# Patient Record
Sex: Female | Born: 1955 | Race: Black or African American | Hispanic: No | Smoking: Never smoker
Health system: Southern US, Community
[De-identification: ages and names within clinical notes are randomized; demographics above are authoritative.]

## PROBLEM LIST (undated history)

## (undated) DIAGNOSIS — I10 Essential (primary) hypertension: Secondary | ICD-10-CM

## (undated) DIAGNOSIS — T7840XA Allergy, unspecified, initial encounter: Secondary | ICD-10-CM

## (undated) DIAGNOSIS — E785 Hyperlipidemia, unspecified: Secondary | ICD-10-CM

## (undated) DIAGNOSIS — E119 Type 2 diabetes mellitus without complications: Secondary | ICD-10-CM

## (undated) HISTORY — PX: ABDOMINAL HYSTERECTOMY: SHX81

## (undated) HISTORY — DX: Allergy, unspecified, initial encounter: T78.40XA

## (undated) HISTORY — DX: Essential (primary) hypertension: I10

## (undated) HISTORY — DX: Type 2 diabetes mellitus without complications: E11.9

## (undated) HISTORY — DX: Hyperlipidemia, unspecified: E78.5

---

## 1998-04-16 ENCOUNTER — Ambulatory Visit (HOSPITAL_COMMUNITY): Admission: RE | Admit: 1998-04-16 | Discharge: 1998-04-16 | Payer: Self-pay | Admitting: *Deleted

## 1999-12-09 ENCOUNTER — Other Ambulatory Visit: Admission: RE | Admit: 1999-12-09 | Discharge: 1999-12-09 | Payer: Self-pay | Admitting: Obstetrics and Gynecology

## 2000-02-16 ENCOUNTER — Encounter: Payer: Self-pay | Admitting: Family Medicine

## 2000-02-16 ENCOUNTER — Ambulatory Visit (HOSPITAL_COMMUNITY): Admission: RE | Admit: 2000-02-16 | Discharge: 2000-02-16 | Payer: Self-pay | Admitting: Family Medicine

## 2000-12-27 ENCOUNTER — Other Ambulatory Visit: Admission: RE | Admit: 2000-12-27 | Discharge: 2000-12-27 | Payer: Self-pay | Admitting: Obstetrics and Gynecology

## 2002-02-23 ENCOUNTER — Other Ambulatory Visit: Admission: RE | Admit: 2002-02-23 | Discharge: 2002-02-23 | Payer: Self-pay | Admitting: Obstetrics and Gynecology

## 2003-01-08 ENCOUNTER — Encounter: Payer: Self-pay | Admitting: Emergency Medicine

## 2003-01-08 ENCOUNTER — Emergency Department (HOSPITAL_COMMUNITY): Admission: EM | Admit: 2003-01-08 | Discharge: 2003-01-08 | Payer: Self-pay | Admitting: Emergency Medicine

## 2003-04-25 ENCOUNTER — Other Ambulatory Visit: Admission: RE | Admit: 2003-04-25 | Discharge: 2003-04-25 | Payer: Self-pay | Admitting: Obstetrics and Gynecology

## 2004-05-07 ENCOUNTER — Other Ambulatory Visit: Admission: RE | Admit: 2004-05-07 | Discharge: 2004-05-07 | Payer: Self-pay | Admitting: Obstetrics and Gynecology

## 2005-06-15 ENCOUNTER — Other Ambulatory Visit: Admission: RE | Admit: 2005-06-15 | Discharge: 2005-06-15 | Payer: Self-pay | Admitting: Obstetrics and Gynecology

## 2007-10-04 ENCOUNTER — Ambulatory Visit (HOSPITAL_COMMUNITY): Admission: RE | Admit: 2007-10-04 | Discharge: 2007-10-04 | Payer: Self-pay | Admitting: Family Medicine

## 2007-10-04 ENCOUNTER — Encounter (INDEPENDENT_AMBULATORY_CARE_PROVIDER_SITE_OTHER): Payer: Self-pay | Admitting: Family Medicine

## 2007-10-12 ENCOUNTER — Emergency Department (HOSPITAL_COMMUNITY): Admission: EM | Admit: 2007-10-12 | Discharge: 2007-10-12 | Payer: Self-pay | Admitting: Emergency Medicine

## 2010-01-07 ENCOUNTER — Encounter: Admission: RE | Admit: 2010-01-07 | Discharge: 2010-01-07 | Payer: Self-pay | Admitting: Family Medicine

## 2010-01-14 ENCOUNTER — Ambulatory Visit (HOSPITAL_COMMUNITY): Admission: RE | Admit: 2010-01-14 | Discharge: 2010-01-14 | Payer: Self-pay | Admitting: General Surgery

## 2010-03-16 ENCOUNTER — Encounter: Admission: RE | Admit: 2010-03-16 | Discharge: 2010-03-16 | Payer: Self-pay | Admitting: General Surgery

## 2010-05-21 ENCOUNTER — Encounter (INDEPENDENT_AMBULATORY_CARE_PROVIDER_SITE_OTHER): Payer: Self-pay | Admitting: Obstetrics and Gynecology

## 2010-05-21 ENCOUNTER — Ambulatory Visit (HOSPITAL_COMMUNITY): Admission: RE | Admit: 2010-05-21 | Discharge: 2010-05-22 | Payer: Self-pay | Admitting: Obstetrics and Gynecology

## 2010-09-20 ENCOUNTER — Encounter: Payer: Self-pay | Admitting: Family Medicine

## 2010-11-12 LAB — COMPREHENSIVE METABOLIC PANEL
ALT: 28 U/L (ref 0–35)
Albumin: 4 g/dL (ref 3.5–5.2)
CO2: 27 mEq/L (ref 19–32)
Chloride: 103 mEq/L (ref 96–112)
Creatinine, Ser: 0.63 mg/dL (ref 0.4–1.2)
GFR calc Af Amer: >60 mL/min (ref >60)
Potassium: 3.8 mEq/L (ref 3.5–5.1)
Total Protein: 7.9 g/dL (ref 6.0–8.3)

## 2010-11-12 LAB — DIFFERENTIAL
Basophils Absolute: 0 10*3/uL (ref 0.0–0.1)
Basophils Relative: 0 % (ref 0–1)
Eosinophils Absolute: 0.2 10*3/uL (ref 0.0–0.7)
Lymphocytes Relative: 24 % (ref 12–46)
Lymphs Abs: 1.5 10*3/uL (ref 0.7–4.0)
Monocytes Absolute: 0.7 10*3/uL (ref 0.1–1.0)
Neutro Abs: 3.6 10*3/uL (ref 1.7–7.7)

## 2010-11-12 LAB — CBC
MCH: 27.6 pg (ref 26.0–34.0)
MCHC: 33.7 g/dL (ref 30.0–36.0)
MCV: 82.9 fL (ref 78.0–100.0)
Platelets: 188 10*3/uL (ref 150–400)
RBC: 3.94 MIL/uL (ref 3.87–5.11)
RDW: 14.7 % (ref 11.5–15.5)
WBC: 11.9 10*3/uL — ABNORMAL HIGH (ref 4.0–10.5)

## 2010-11-12 LAB — TYPE AND SCREEN
ABO/RH(D): O POS
Antibody Screen: NEGATIVE

## 2010-11-16 LAB — CBC
Hemoglobin: 10.8 g/dL — ABNORMAL LOW (ref 12.0–15.0)
MCV: 84.6 fL (ref 78.0–100.0)
Platelets: 293 10*3/uL (ref 150–400)
RBC: 3.82 MIL/uL — ABNORMAL LOW (ref 3.87–5.11)

## 2010-11-16 LAB — PROTIME-INR: Prothrombin Time: 13.2 seconds (ref 11.6–15.2)

## 2011-05-21 LAB — COMPREHENSIVE METABOLIC PANEL
Alkaline Phosphatase: 54
BUN: 6
GFR calc non Af Amer: >60
Glucose, Bld: 97
Potassium: 3.9
Total Bilirubin: 0.5
Total Protein: 7.4

## 2011-05-21 LAB — URINALYSIS, ROUTINE W REFLEX MICROSCOPIC
Bilirubin Urine: NEGATIVE
Protein, ur: NEGATIVE
Specific Gravity, Urine: 1.004 — ABNORMAL LOW
Urobilinogen, UA: 0.2

## 2011-05-21 LAB — URINE MICROSCOPIC-ADD ON

## 2011-05-21 LAB — POCT PREGNANCY, URINE
Operator id: 198171
Preg Test, Ur: NEGATIVE

## 2011-05-21 LAB — POCT CARDIAC MARKERS
CKMB, poc: <1 — ABNORMAL LOW
Myoglobin, poc: 29.6
Myoglobin, poc: 39.6
Operator id: 198171
Troponin i, poc: <0.05

## 2011-05-21 LAB — URINE CULTURE

## 2011-05-21 LAB — CBC
HCT: 41.6
Hemoglobin: 13.9
RBC: 5.01
RDW: 13.7
WBC: 7.4

## 2011-05-21 LAB — PROTIME-INR
INR: 0.9
Prothrombin Time: 12

## 2011-05-21 LAB — DIFFERENTIAL: Neutrophils Relative %: 63

## 2013-05-30 ENCOUNTER — Encounter: Payer: BC Managed Care – PPO | Attending: Family Medicine | Admitting: *Deleted

## 2013-05-30 ENCOUNTER — Encounter: Payer: Self-pay | Admitting: *Deleted

## 2013-05-30 VITALS — Wt 185.3 lb

## 2013-05-30 DIAGNOSIS — Z713 Dietary counseling and surveillance: Secondary | ICD-10-CM | POA: Insufficient documentation

## 2013-05-30 DIAGNOSIS — E119 Type 2 diabetes mellitus without complications: Secondary | ICD-10-CM | POA: Insufficient documentation

## 2013-05-30 NOTE — Progress Notes (Signed)
Appt start time: 1500 end time:  1630.  Assessment:  Patient was seen on  05/30/13 for individual diabetes education. Lamar comes to [facility] for DSME r/t to current A1c of 8.3%. She has a significant family history of T2DM, HTN, HLD. Her physician wanted to put her on medication which she declined. He has proposed a three month trial of diet modification and exercise.  Current HbA1c: 8.3%  MEDICATIONS: See List...none for diabetes   DIETARY INTAKE:  Usual eating pattern includes 3 meals and 0 snacks per day.  Everyday foods include wide variety of food groups.  Avoided foods include none.    24-hr recall:  B ( AM): salmon biscuit,   Snk ( AM): special K cereal cinnamon pecan 1/2 cup  L ( PM): grilled chicken [employer] (no bread), cole slaw, fruit punch Snk ( PM): none D ( PM): broiled salmon, cole slaw, sweet potato, 2 hush puppies Snk ( PM): none Beverages: water, sweet ice tea, crystal light  Usual physical activity: stationary bicycle 10 minutes 2-3 days per week    Intervention:  Nutrition counseling provided.  Discussed diabetes disease process and treatment options.  Discussed physiology of diabetes and role of obesity on insulin resistance.  Encouraged moderate weight reduction to improve glucose levels.  Discussed role of medications and diet in glucose control  Provided education on macronutrients on glucose levels.  Provided education on carb counting, importance of regularly scheduled meals/snacks, and meal planning  Discussed effects of physical activity on glucose levels and long-term glucose control.  Recommended 150 minutes of physical activity/week.  Reviewed patient medications.  Discussed role of medication on blood glucose and possible side effects  Discussed blood glucose monitoring and interpretation.  Discussed recommended target ranges and individual ranges.    Described short-term complications: hyper- and hypo-glycemia.  Discussed causes,symptoms, and treatment  options.  Discussed prevention, detection, and treatment of long-term complications.  Discussed the role of prolonged elevated glucose levels on body systems.  Discussed role of stress on blood glucose levels and discussed strategies to manage psychosocial issues.  Discussed recommendations for long-term diabetes self-care.  Established checklist for medical, dental, and emotional self-care.  Handouts given during visit include: Living Well with Diabetes Carb Counting  Meal Plan Card  Plan:  Aim for 3 Carb Choices per meal (45 grams) +/- 1 either way  Aim for 0-1 Carbs per snack if hungry  Consider reading food labels for Total Carbohydrate and Fat Grams of foods Consider  increasing your activity level by walking and stationary bicycle for 30 minutes daily as tolerated Consider checking BG at alternate times per day to include fasting and 2hours after first bite of supper as directed by MD   Barriers to learning/adherance to lifestyle change: none  Diabetes self-care support plan:   [facility] support group  sister  Monitoring/Evaluation:  Dietary intake, exercise, monitor glucose, and body weight in 6 week(s).

## 2013-07-10 ENCOUNTER — Ambulatory Visit: Payer: BC Managed Care – PPO | Admitting: *Deleted

## 2015-12-16 DIAGNOSIS — R102 Pelvic and perineal pain: Secondary | ICD-10-CM | POA: Diagnosis not present

## 2015-12-17 DIAGNOSIS — R102 Pelvic and perineal pain: Secondary | ICD-10-CM | POA: Diagnosis not present

## 2015-12-19 DIAGNOSIS — R102 Pelvic and perineal pain: Secondary | ICD-10-CM | POA: Diagnosis not present

## 2015-12-19 DIAGNOSIS — Z0000 Encounter for general adult medical examination without abnormal findings: Secondary | ICD-10-CM | POA: Diagnosis not present

## 2015-12-19 DIAGNOSIS — E782 Mixed hyperlipidemia: Secondary | ICD-10-CM | POA: Diagnosis not present

## 2015-12-19 DIAGNOSIS — Z1211 Encounter for screening for malignant neoplasm of colon: Secondary | ICD-10-CM | POA: Diagnosis not present

## 2015-12-19 DIAGNOSIS — E1165 Type 2 diabetes mellitus with hyperglycemia: Secondary | ICD-10-CM | POA: Diagnosis not present

## 2015-12-19 DIAGNOSIS — I10 Essential (primary) hypertension: Secondary | ICD-10-CM | POA: Diagnosis not present

## 2016-04-12 DIAGNOSIS — J029 Acute pharyngitis, unspecified: Secondary | ICD-10-CM | POA: Diagnosis not present

## 2016-05-28 DIAGNOSIS — J029 Acute pharyngitis, unspecified: Secondary | ICD-10-CM | POA: Diagnosis not present

## 2016-06-21 DIAGNOSIS — I10 Essential (primary) hypertension: Secondary | ICD-10-CM | POA: Diagnosis not present

## 2016-06-21 DIAGNOSIS — Z23 Encounter for immunization: Secondary | ICD-10-CM | POA: Diagnosis not present

## 2016-06-21 DIAGNOSIS — E782 Mixed hyperlipidemia: Secondary | ICD-10-CM | POA: Diagnosis not present

## 2016-06-21 DIAGNOSIS — E1165 Type 2 diabetes mellitus with hyperglycemia: Secondary | ICD-10-CM | POA: Diagnosis not present

## 2016-07-27 DIAGNOSIS — Z1151 Encounter for screening for human papillomavirus (HPV): Secondary | ICD-10-CM | POA: Diagnosis not present

## 2016-07-27 DIAGNOSIS — Z113 Encounter for screening for infections with a predominantly sexual mode of transmission: Secondary | ICD-10-CM | POA: Diagnosis not present

## 2016-07-27 DIAGNOSIS — Z01419 Encounter for gynecological examination (general) (routine) without abnormal findings: Secondary | ICD-10-CM | POA: Diagnosis not present

## 2016-07-27 DIAGNOSIS — Z1231 Encounter for screening mammogram for malignant neoplasm of breast: Secondary | ICD-10-CM | POA: Diagnosis not present

## 2016-07-27 DIAGNOSIS — Z130 Encounter for screening for diseases of the blood and blood-forming organs and certain disorders involving the immune mechanism: Secondary | ICD-10-CM | POA: Diagnosis not present

## 2016-07-27 DIAGNOSIS — Z1389 Encounter for screening for other disorder: Secondary | ICD-10-CM | POA: Diagnosis not present

## 2016-07-27 DIAGNOSIS — Z6829 Body mass index (BMI) 29.0-29.9, adult: Secondary | ICD-10-CM | POA: Diagnosis not present

## 2016-07-27 DIAGNOSIS — R87610 Atypical squamous cells of undetermined significance on cytologic smear of cervix (ASC-US): Secondary | ICD-10-CM | POA: Diagnosis not present

## 2016-07-27 DIAGNOSIS — Z202 Contact with and (suspected) exposure to infections with a predominantly sexual mode of transmission: Secondary | ICD-10-CM | POA: Diagnosis not present

## 2016-07-27 DIAGNOSIS — Z124 Encounter for screening for malignant neoplasm of cervix: Secondary | ICD-10-CM | POA: Diagnosis not present

## 2016-09-28 DIAGNOSIS — N870 Mild cervical dysplasia: Secondary | ICD-10-CM | POA: Diagnosis not present

## 2016-09-28 DIAGNOSIS — N879 Dysplasia of cervix uteri, unspecified: Secondary | ICD-10-CM | POA: Diagnosis not present

## 2016-12-16 DIAGNOSIS — I10 Essential (primary) hypertension: Secondary | ICD-10-CM | POA: Diagnosis not present

## 2016-12-16 DIAGNOSIS — E1165 Type 2 diabetes mellitus with hyperglycemia: Secondary | ICD-10-CM | POA: Diagnosis not present

## 2016-12-16 DIAGNOSIS — J309 Allergic rhinitis, unspecified: Secondary | ICD-10-CM | POA: Diagnosis not present

## 2016-12-16 DIAGNOSIS — E782 Mixed hyperlipidemia: Secondary | ICD-10-CM | POA: Diagnosis not present

## 2017-06-17 DIAGNOSIS — Z23 Encounter for immunization: Secondary | ICD-10-CM | POA: Diagnosis not present

## 2017-06-20 DIAGNOSIS — I10 Essential (primary) hypertension: Secondary | ICD-10-CM | POA: Diagnosis not present

## 2017-06-20 DIAGNOSIS — E1165 Type 2 diabetes mellitus with hyperglycemia: Secondary | ICD-10-CM | POA: Diagnosis not present

## 2017-06-20 DIAGNOSIS — E782 Mixed hyperlipidemia: Secondary | ICD-10-CM | POA: Diagnosis not present

## 2017-06-20 DIAGNOSIS — J309 Allergic rhinitis, unspecified: Secondary | ICD-10-CM | POA: Diagnosis not present

## 2017-08-02 DIAGNOSIS — Z0000 Encounter for general adult medical examination without abnormal findings: Secondary | ICD-10-CM | POA: Diagnosis not present

## 2017-10-11 ENCOUNTER — Ambulatory Visit
Admission: RE | Admit: 2017-10-11 | Discharge: 2017-10-11 | Disposition: A | Discharge status: Home / Self Care | Payer: BLUE CROSS/BLUE SHIELD | Source: Ambulatory Visit | Attending: Family Medicine | Admitting: Family Medicine

## 2017-10-11 ENCOUNTER — Other Ambulatory Visit: Payer: Self-pay | Admitting: Family Medicine

## 2017-10-11 DIAGNOSIS — M7989 Other specified soft tissue disorders: Secondary | ICD-10-CM | POA: Diagnosis not present

## 2017-10-11 DIAGNOSIS — L52 Erythema nodosum: Secondary | ICD-10-CM

## 2017-10-11 DIAGNOSIS — J9811 Atelectasis: Secondary | ICD-10-CM | POA: Diagnosis not present

## 2017-10-11 DIAGNOSIS — I10 Essential (primary) hypertension: Secondary | ICD-10-CM | POA: Diagnosis not present

## 2017-10-12 ENCOUNTER — Other Ambulatory Visit: Payer: Self-pay | Admitting: Family Medicine

## 2017-10-12 DIAGNOSIS — R590 Localized enlarged lymph nodes: Secondary | ICD-10-CM

## 2017-10-19 DIAGNOSIS — Z01419 Encounter for gynecological examination (general) (routine) without abnormal findings: Secondary | ICD-10-CM | POA: Diagnosis not present

## 2017-10-19 DIAGNOSIS — A609 Anogenital herpesviral infection, unspecified: Secondary | ICD-10-CM | POA: Diagnosis not present

## 2017-10-19 DIAGNOSIS — Z1231 Encounter for screening mammogram for malignant neoplasm of breast: Secondary | ICD-10-CM | POA: Diagnosis not present

## 2017-10-19 DIAGNOSIS — Z1389 Encounter for screening for other disorder: Secondary | ICD-10-CM | POA: Diagnosis not present

## 2017-10-19 DIAGNOSIS — Z1151 Encounter for screening for human papillomavirus (HPV): Secondary | ICD-10-CM | POA: Diagnosis not present

## 2017-10-19 DIAGNOSIS — Z130 Encounter for screening for diseases of the blood and blood-forming organs and certain disorders involving the immune mechanism: Secondary | ICD-10-CM | POA: Diagnosis not present

## 2017-10-19 DIAGNOSIS — Z6829 Body mass index (BMI) 29.0-29.9, adult: Secondary | ICD-10-CM | POA: Diagnosis not present

## 2017-10-19 DIAGNOSIS — Z124 Encounter for screening for malignant neoplasm of cervix: Secondary | ICD-10-CM | POA: Diagnosis not present

## 2017-10-20 DIAGNOSIS — R87610 Atypical squamous cells of undetermined significance on cytologic smear of cervix (ASC-US): Secondary | ICD-10-CM | POA: Diagnosis not present

## 2017-10-20 DIAGNOSIS — Z124 Encounter for screening for malignant neoplasm of cervix: Secondary | ICD-10-CM | POA: Diagnosis not present

## 2017-10-27 ENCOUNTER — Ambulatory Visit
Admission: RE | Admit: 2017-10-27 | Discharge: 2017-10-27 | Disposition: A | Discharge status: Home / Self Care | Payer: BLUE CROSS/BLUE SHIELD | Source: Ambulatory Visit | Attending: Family Medicine | Admitting: Family Medicine

## 2017-10-27 DIAGNOSIS — J181 Lobar pneumonia, unspecified organism: Secondary | ICD-10-CM | POA: Diagnosis not present

## 2017-10-27 DIAGNOSIS — R590 Localized enlarged lymph nodes: Secondary | ICD-10-CM

## 2017-10-27 MED ORDER — IOPAMIDOL (ISOVUE-300) INJECTION 61%
75.0000 mL | Freq: Once | INTRAVENOUS | Status: AC | PRN
  Administered 2017-10-27: 75 mL via INTRAVENOUS

## 2017-11-25 DIAGNOSIS — N879 Dysplasia of cervix uteri, unspecified: Secondary | ICD-10-CM | POA: Diagnosis not present

## 2017-11-25 DIAGNOSIS — A630 Anogenital (venereal) warts: Secondary | ICD-10-CM | POA: Diagnosis not present

## 2017-12-01 ENCOUNTER — Ambulatory Visit: Payer: BLUE CROSS/BLUE SHIELD | Admitting: Pulmonary Disease

## 2017-12-01 ENCOUNTER — Encounter: Payer: Self-pay | Admitting: Pulmonary Disease

## 2017-12-01 VITALS — BP 126/82 | HR 82 | Ht 64.0 in | Wt 167.0 lb

## 2017-12-01 DIAGNOSIS — D869 Sarcoidosis, unspecified: Secondary | ICD-10-CM | POA: Diagnosis not present

## 2017-12-01 NOTE — Patient Instructions (Signed)
Sarcoidosis: As described today this is an inflammatory condition that causes her lymph nodes to enlarge and can affect any tissue of the body We will check an EKG today Tell your eye doctor that she had this diagnosis We will check blood work today to make sure there is no evidence of kidney or liver involvement We will check a lung function test now and then will plan on checking another one in 6 months when you come back Me know if you develop any shortness of breath or cough  We will plan on seeing you back in 6 months or sooner if needed

## 2017-12-01 NOTE — Progress Notes (Signed)
Subjective:   PATIENT ID: Anita Myers GENDER: female DOB: 10/25/1955, MRN: [id number]  Synopsis: Referred in April 2019 for an abnormal CT chest that showed mediastinal lymphadenopathy and pulmonary nodules.  HPI  Chief Complaint  Patient presents with  . Follow-up    Dr. Azucena CecilSwayne- Feels good at this time no sob    Anita Myers says that she was lying in bed crossing her legs and she had an abnormal feeling that raised concern for a blood clot.  She saw her PCP and he arranged for a CXR and blood work.  The CXR showed lymphadenopathy.  Leg rash and swelling: > it is mildly tender > it is swollen > no bug bites or injury  No history of lung disease.  She doesn't think she is short of breath out of proportion to her weight and age.  She push mows her yard without difficulty.  No unexplained cough.  She had a cough a few years ago and none now.    She has allergic rhinitis well controlled on claritin.    She says she smoked one day in her life.  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  Worked in a Public librarianhosiery mill three months.   She has acid reflux well controlled on Prilosec.   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  Past Medical History:  Diagnosis Date  . Allergy   . Diabetes mellitus without complication (HCC)   . Hyperlipidemia   . Hypertension      Family History  Problem Relation Age of Onset  . Diabetes Mother   . Hypertension Mother   . Diabetes Father   . Hypertension Father   . Diabetes Sister   . Hypertension Sister   . Hypertension Brother   . Hypertension Maternal Grandmother   . Diabetes Maternal Grandmother   . Cancer Maternal Grandmother   . Cancer Maternal Grandfather      Social History   Socioeconomic History  . Marital status: Legally Separated    Spouse name: Not on file  . Number of children: Not on file  . Years of education: Not on file  . Highest education  level: Not on file  Occupational History  . Not on file  Social Needs  . Financial resource strain: Not on file  . Food insecurity:    Worry: Not on file    Inability: Not on file  . Transportation needs:    Medical: Not on file    Non-medical: Not on file  Tobacco Use  . Smoking status: Never Smoker  . Smokeless tobacco: Never Used  Substance and Sexual Activity  . Alcohol use: Not on file  . Drug use: Not on file  . Sexual activity: Not on file  Lifestyle  . Physical activity:    Days per week: Not on file    Minutes per session: Not on file  . Stress: Not on file  Relationships  . Social connections:    Talks on phone: Not on file    Gets together: Not on file    Attends religious service: Not on file    Active member of club or organization: Not on file    Attends meetings of clubs or organizations: Not on file    Relationship status: Not on file  . Intimate partner violence:    Fear of current or ex partner: Not  on file    Emotionally abused: Not on file    Physically abused: Not on file    Forced sexual activity: Not on file  Other Topics Concern  . Not on file  Social History Narrative  . Not on file     Allergies  Allergen Reactions  . Zovirax [Acyclovir] Rash     Outpatient Medications Prior to Visit  Medication Sig Dispense Refill  . atorvastatin (LIPITOR) 20 MG tablet Take 20 mg by mouth daily.    Marland Kitchen loratadine (CLARITIN) 10 MG tablet Take 10 mg by mouth daily.    . metFORMIN (GLUCOPHAGE) 1000 MG tablet Take 1,000 mg by mouth 2 (two) times daily with a meal.    . losartan (COZAAR) 100 MG tablet Take 1 tablet by mouth daily.    . hydrochlorothiazide (HYDRODIURIL) 12.5 MG tablet Take 12.5 mg by mouth daily.     No facility-administered medications prior to visit.     Review of Systems  Constitutional: Negative for chills, fever, malaise/fatigue and weight loss.  HENT: Negative for congestion, nosebleeds, sinus pain and sore throat.   Eyes: Negative  for photophobia, pain and discharge.  Respiratory: Negative for cough, hemoptysis, sputum production, shortness of breath and wheezing.   Cardiovascular: Negative for chest pain, palpitations, orthopnea and leg swelling.  Gastrointestinal: Negative for abdominal pain, constipation, diarrhea, nausea and vomiting.  Genitourinary: Negative for dysuria, frequency, hematuria and urgency.  Musculoskeletal: Negative for back pain, joint pain, myalgias and neck pain.  Skin: Negative for itching and rash.  Neurological: Negative for tingling, tremors, sensory change, speech change, focal weakness, seizures, weakness and headaches.  Psychiatric/Behavioral: Negative for memory loss, substance abuse and suicidal ideas. The patient is not nervous/anxious.       Objective:  Physical Exam   Vitals:   12/01/17 1029 12/01/17 1030  BP:  126/82  Pulse:  82  SpO2:  98%  Weight: 167 lb (75.8 kg)   Height: 5\' 4"  (1.626 m)    RA  Gen: well appearing, no acute distress HENT: NCAT, OP clear, neck supple without masses Eyes: PERRL, EOMi Lymph: no cervical lymphadenopathy PULM: CTA B CV: RRR, no mgr, no JVD GI: BS+, soft, nontender, no hsm Derm: erythematous raised red rash anterior shin left leg, otherwise no rash or skin breakdown MSK: normal bulk and tone Neuro: A&Ox4, CN II-XII intact, strength 5/5 in all 4 extremities Psyche: normal mood and affect   CBC    Component Value Date/Time   WBC 11.9 (H) 05/22/2010 0520   RBC 3.94 05/22/2010 0520   HGB 10.9 (L) 05/22/2010 0520   HCT 32.6 (L) 05/22/2010 0520   PLT 170 05/22/2010 0520   MCV 82.9 05/22/2010 0520   MCH 27.6 05/22/2010 0520   MCHC 33.3 05/22/2010 0520   RDW 14.7 05/22/2010 0520   LYMPHSABS 1.5 05/14/2010 0918   MONOABS 0.7 05/14/2010 0918   EOSABS 0.2 05/14/2010 0918   BASOSABS 0.0 05/14/2010 0918     Chest imaging: 09/2017 CT chest images personally reviewed showing: bulky hilar adenopathy, scattered pulmonary nodules, all  less than 1 cm, some in intrapleural space  PFT:  Labs:  Path:  Echo:  Heart Catheterization:   EKG: 11/2017: no conduction abnormalities      Assessment & Plan:   Sarcoidosis  Discussion: Chip Boer has sarcoidosis.  She has mediastinal lymphadenopathy which has been stable over 8 years time based on imaging, and she has erythema nodosum.  Typically prior to making a diagnosis of sarcoidosis I  pursue a tissue diagnosis but with the incidence of erythema nodosum and mediastinal lymphadenopathy there is no indication for a biopsy.  Today we talked about the significance of this diagnosis, specifically how sarcoidosis can affect any tissue of the body.  At this time she seems to have stage I radiographic disease without significant symptoms that would benefit from systemic steroids.  I told her though that if she develops shortness of breath or cough then she needs to come see me sooner.  Plan: Sarcoidosis: As described today this is an inflammatory condition that causes her lymph nodes to enlarge and can affect any tissue of the body We will check an EKG today Tell your eye doctor that she had this diagnosis We will check blood work today to make sure there is no evidence of kidney or liver involvement We will check a lung function test now and then will plan on checking another one in 6 months when you come back Me know if you develop any shortness of breath or cough  We will plan on seeing you back in 6 months or sooner if needed    Current Outpatient Medications:  .  atorvastatin (LIPITOR) 20 MG tablet, Take 20 mg by mouth daily., Disp: , Rfl:  .  loratadine (CLARITIN) 10 MG tablet, Take 10 mg by mouth daily., Disp: , Rfl:  .  metFORMIN (GLUCOPHAGE) 1000 MG tablet, Take 1,000 mg by mouth 2 (two) times daily with a meal., Disp: , Rfl:  .  losartan (COZAAR) 100 MG tablet, Take 1 tablet by mouth daily., Disp: , Rfl:

## 2017-12-02 ENCOUNTER — Ambulatory Visit (INDEPENDENT_AMBULATORY_CARE_PROVIDER_SITE_OTHER): Payer: BLUE CROSS/BLUE SHIELD | Admitting: Pulmonary Disease

## 2017-12-02 DIAGNOSIS — D869 Sarcoidosis, unspecified: Secondary | ICD-10-CM | POA: Diagnosis not present

## 2017-12-02 LAB — PULMONARY FUNCTION TEST
DL/VA % pred: 81 %
DL/VA: 3.9 ml/min/mmHg/L
DLCO unc % pred: 51 %
DLCO unc: 12.46 ml/min/mmHg
FEF 25-75 PRE: 2.82 L/s
FEF 25-75 Post: 2.46 L/sec
FEF2575-%CHANGE-POST: -12 %
FEF2575-%PRED-POST: 118 %
FEF2575-%Pred-Pre: 135 %
FEV1-%CHANGE-POST: -3 %
FEV1-%Pred-Post: 84 %
FEV1-%Pred-Pre: 88 %
FEV1-POST: 1.78 L
FEV1-PRE: 1.84 L
FEV1FVC-%CHANGE-POST: 0 %
FEV1FVC-%PRED-PRE: 111 %
FEV6-%Change-Post: -2 %
FEV6-%Pred-Post: 78 %
FEV6-%Pred-Pre: 81 %
FEV6-PRE: 2.08 L
FEV6-Post: 2.03 L
FEV6FVC-%Pred-Post: 103 %
FEV6FVC-%Pred-Pre: 103 %
FVC-%Change-Post: -2 %
FVC-%PRED-POST: 76 %
FVC-%Pred-Pre: 78 %
FVC-PRE: 2.08 L
FVC-Post: 2.03 L
POST FEV1/FVC RATIO: 88 %
PRE FEV6/FVC RATIO: 100 %
Post FEV6/FVC ratio: 100 %
Pre FEV1/FVC ratio: 88 %
RV % PRED: 57 %
RV: 1.15 L
TLC % pred: 65 %
TLC: 3.32 L

## 2017-12-02 NOTE — Progress Notes (Signed)
PFT completed 12/02/17  

## 2017-12-12 ENCOUNTER — Telehealth: Payer: Self-pay | Admitting: Pulmonary Disease

## 2017-12-12 NOTE — Telephone Encounter (Signed)
Called patient back. Patient stated that the swelling is getting worse in both legs but more the left than the right. She also stated that the rash is worse on the left leg as well. Patient shared that the swelling is worse in the evenings and that she had tried wearing compression socks as she was instructed but that they are hard to remove. Patient stated that she has been taking pain relievers but that she is worried about how long is she supposed to stay on pain relievers. Patient is also asking for test result.  BQ please advise.

## 2017-12-12 NOTE — Telephone Encounter (Signed)
The treatment would involve systemic steroids.  We would need to discuss this in person to make sure she is OK with that plan.  NP visit would be OK too

## 2017-12-12 NOTE — Telephone Encounter (Signed)
Called and spoke to patient. Passed on the information given by BQ. Scheduled appointment for BQ on 4/16 at 1115. Nothing further is needed at this time.

## 2017-12-13 ENCOUNTER — Encounter: Payer: Self-pay | Admitting: Pulmonary Disease

## 2017-12-13 ENCOUNTER — Ambulatory Visit: Payer: BLUE CROSS/BLUE SHIELD | Admitting: Pulmonary Disease

## 2017-12-13 VITALS — BP 128/82 | HR 78 | Ht 64.0 in | Wt 167.0 lb

## 2017-12-13 DIAGNOSIS — L52 Erythema nodosum: Secondary | ICD-10-CM | POA: Diagnosis not present

## 2017-12-13 DIAGNOSIS — D869 Sarcoidosis, unspecified: Secondary | ICD-10-CM | POA: Diagnosis not present

## 2017-12-13 MED ORDER — PREDNISONE 10 MG PO TABS: ORAL_TABLET | ORAL | 0 refills | Status: AC

## 2017-12-13 NOTE — Patient Instructions (Signed)
Pulmonary sarcoidosis: You have lymph node enlargement in your chest as well as pulmonary nodules due to a condition called pulmonary sarcoidosis We will repeat a lung function test in 6 months and see you back in 6 months If you have worsening shortness of breath, cough, or other respiratory complaints please let us know between now and then  Erythema nodosum: This is the medical term for the redness that you have been experiencing in your legs and is a consequence of sarcoidosis.  Because the you still have the symptoms despite using the aspirin and hydrocortisone cream we are going to use a short course of prednisone.  Take 20 mg daily times 10 days then 10 mg daily times 10 days.  If you still have the problem after that then we will need to get you in contact with a dermatologist to help you manage it further.  We will plan on seeing you back in 6 months or sooner if needed

## 2017-12-13 NOTE — Progress Notes (Signed)
Subjective:   PATIENT ID: Anita Myers GENDER: female DOB: 04/05/1956, MRN: [id number]  Synopsis: Referred in April 2019 for an abnormal CT chest that showed mediastinal lymphadenopathy and pulmonary nodules.  She had erythema nodosum as well and was diagnosed with sarcoidosis.   HPI  Chief Complaint  Patient presents with  . Acute Visit    c/o b/l leg swelling, L worse than R.  Denies any current breathing complaints.     Anita Myers has returned today because she still has swelling and sores on her legs.  She says that she has been using nonsteroidal anti-inflammatories (ibuprofen and aspirin) at home but this is not helped.  She has also been using hydrocortisone cream but this is not helped either.  She denies any problems with shortness of breath or cough.  Past Medical History:  Diagnosis Date  . Allergy   . Diabetes mellitus without complication (HCC)   . Hyperlipidemia   . Hypertension       Review of Systems  Constitutional: Negative for chills, fever, malaise/fatigue and weight loss.  HENT: Negative for congestion, nosebleeds, sinus pain and sore throat.   Eyes: Negative for photophobia, pain and discharge.  Respiratory: Negative for cough, hemoptysis, sputum production, shortness of breath and wheezing.   Cardiovascular: Negative for chest pain, palpitations, orthopnea and leg swelling.  Gastrointestinal: Negative for abdominal pain, constipation, diarrhea, nausea and vomiting.  Genitourinary: Negative for dysuria, frequency, hematuria and urgency.  Musculoskeletal: Negative for back pain, joint pain, myalgias and neck pain.  Skin: Negative for itching and rash.  Neurological: Negative for tingling, tremors, sensory change, speech change, focal weakness, seizures, weakness and headaches.  Psychiatric/Behavioral: Negative for memory loss, substance abuse and suicidal ideas. The patient is not nervous/anxious.       Objective:  Physical Exam   Vitals:   12/13/17  1119  BP: 128/82  Pulse: 78  SpO2: 100%  Weight: 167 lb (75.8 kg)  Height: 5\' 4"  (1.626 m)   RA  Gen: well appearing HENT: OP clear, TM's clear, neck supple PULM: CTA B, normal percussion CV: RRR, no mgr, trace edema GI: BS+, soft, nontender Derm:erythematous rash over left shin, mild ankle edema Psyche: normal mood and affect   CBC    Component Value Date/Time   WBC 11.9 (H) 05/22/2010 0520   RBC 3.94 05/22/2010 0520   HGB 10.9 (L) 05/22/2010 0520   HCT 32.6 (L) 05/22/2010 0520   PLT 170 05/22/2010 0520   MCV 82.9 05/22/2010 0520   MCH 27.6 05/22/2010 0520   MCHC 33.3 05/22/2010 0520   RDW 14.7 05/22/2010 0520   LYMPHSABS 1.5 05/14/2010 0918   MONOABS 0.7 05/14/2010 0918   EOSABS 0.2 05/14/2010 0918   BASOSABS 0.0 05/14/2010 0918     Chest imaging: 09/2017 CT chest images personally reviewed showing: bulky hilar adenopathy, scattered pulmonary nodules, all less than 1 cm, some in intrapleural space  PFT: April 2019 ratio 88%, FVC 2.08 L 78% predicted, total lung capacity 3.32 L 65% predicted, DLCO 12.254ml 51% predicted  Labs:  Path:  Echo:  Heart Catheterization:   EKG: 11/2017: no conduction abnormalities      Assessment & Plan:   Sarcoidosis - Plan: Pulmonary function test  Erythema nodosum  Discussion: Anita Myers has sarcoidosis: We reviewed this in detail again today.  I am not sure that she remembered our discussion from the last time because we had to cover many of the same topics again today.  I reminded her  that sarcoidosis is an inflammatory problem that can affect every tissue of the body.  In her case it has manifested itself with lymphadenopathy, mild pulmonary nodules, mild restrictive disease without significant shortness of breath and finally erythema nodosum in her legs.  She is quite bothered by the erythema nodosum and despite using topical steroids and systemic nonsteroidal anti-inflammatory medicine she still has the erythema nodosum.  I  explained to her today that the next step in treatment would be using systemic steroids in the form of prednisone which carries a risk of worsening her blood sugar control.  If she did not have benefit from using the prednisone (again, in terms of her erythema nodosum as she has no pulmonary symptoms) then I think the next best step would be to discuss the situation with a dermatologist.  Plan: Pulmonary sarcoidosis: You have lymph node enlargement in your chest as well as pulmonary nodules due to a condition called pulmonary sarcoidosis We will repeat a lung function test in 6 months and see you back in 6 months If you have worsening shortness of breath, cough, or other respiratory complaints please let us know between now and then  Erythema nodosum: This is the medical term for the redness that you have been experiencing in your legs and is a consequence of sarcoidosis.  Because the you still have the symptoms despite using the aspirin and hydrocortisone cream we are going to use a short course of prednisone.  Take 20 mg daily times 10 days then 10 mg daily times 10 days.  If you still have the problem after that then we will need to get you in contact with a dermatologist to help you manage it further.  We will plan on seeing you back in 6 months or sooner if needed    Current Outpatient Medications:  .  atorvastatin (LIPITOR) 20 MG tablet, Take 20 mg by mouth daily., Disp: , Rfl:  .  loratadine (CLARITIN) 10 MG tablet, Take 10 mg by mouth daily., Disp: , Rfl:  .  losartan (COZAAR) 100 MG tablet, Take 1 tablet by mouth daily., Disp: , Rfl:  .  metFORMIN (GLUCOPHAGE) 1000 MG tablet, Take 1,000 mg by mouth 2 (two) times daily with a meal., Disp: , Rfl:  .  predniSONE (DELTASONE) 10 MG tablet, 2 tabs daily X10 days, then 1 tab daily X10 days, then stop., Disp: 30 tablet, Rfl: 0

## 2018-01-30 DIAGNOSIS — E782 Mixed hyperlipidemia: Secondary | ICD-10-CM | POA: Diagnosis not present

## 2018-01-30 DIAGNOSIS — E1169 Type 2 diabetes mellitus with other specified complication: Secondary | ICD-10-CM | POA: Diagnosis not present

## 2018-01-30 DIAGNOSIS — I10 Essential (primary) hypertension: Secondary | ICD-10-CM | POA: Diagnosis not present

## 2018-01-30 DIAGNOSIS — J309 Allergic rhinitis, unspecified: Secondary | ICD-10-CM | POA: Diagnosis not present

## 2018-03-22 DIAGNOSIS — I8312 Varicose veins of left lower extremity with inflammation: Secondary | ICD-10-CM | POA: Diagnosis not present

## 2018-03-31 DIAGNOSIS — M542 Cervicalgia: Secondary | ICD-10-CM | POA: Diagnosis not present

## 2018-07-22 DIAGNOSIS — Z23 Encounter for immunization: Secondary | ICD-10-CM | POA: Diagnosis not present

## 2018-10-03 DIAGNOSIS — I10 Essential (primary) hypertension: Secondary | ICD-10-CM | POA: Diagnosis not present

## 2018-10-03 DIAGNOSIS — E1169 Type 2 diabetes mellitus with other specified complication: Secondary | ICD-10-CM | POA: Diagnosis not present

## 2018-10-03 DIAGNOSIS — R011 Cardiac murmur, unspecified: Secondary | ICD-10-CM | POA: Diagnosis not present

## 2018-10-03 DIAGNOSIS — Z0000 Encounter for general adult medical examination without abnormal findings: Secondary | ICD-10-CM | POA: Diagnosis not present

## 2018-10-03 DIAGNOSIS — Z23 Encounter for immunization: Secondary | ICD-10-CM | POA: Diagnosis not present

## 2018-10-03 DIAGNOSIS — E782 Mixed hyperlipidemia: Secondary | ICD-10-CM | POA: Diagnosis not present

## 2019-01-17 DIAGNOSIS — Z1231 Encounter for screening mammogram for malignant neoplasm of breast: Secondary | ICD-10-CM | POA: Diagnosis not present

## 2019-01-17 DIAGNOSIS — Z130 Encounter for screening for diseases of the blood and blood-forming organs and certain disorders involving the immune mechanism: Secondary | ICD-10-CM | POA: Diagnosis not present

## 2019-01-17 DIAGNOSIS — Z1151 Encounter for screening for human papillomavirus (HPV): Secondary | ICD-10-CM | POA: Diagnosis not present

## 2019-01-17 DIAGNOSIS — R87610 Atypical squamous cells of undetermined significance on cytologic smear of cervix (ASC-US): Secondary | ICD-10-CM | POA: Diagnosis not present

## 2019-01-17 DIAGNOSIS — Z113 Encounter for screening for infections with a predominantly sexual mode of transmission: Secondary | ICD-10-CM | POA: Diagnosis not present

## 2019-01-17 DIAGNOSIS — Z01419 Encounter for gynecological examination (general) (routine) without abnormal findings: Secondary | ICD-10-CM | POA: Diagnosis not present

## 2019-01-17 DIAGNOSIS — Z6828 Body mass index (BMI) 28.0-28.9, adult: Secondary | ICD-10-CM | POA: Diagnosis not present

## 2019-01-17 DIAGNOSIS — Z124 Encounter for screening for malignant neoplasm of cervix: Secondary | ICD-10-CM | POA: Diagnosis not present

## 2019-03-27 DIAGNOSIS — R87610 Atypical squamous cells of undetermined significance on cytologic smear of cervix (ASC-US): Secondary | ICD-10-CM | POA: Diagnosis not present

## 2019-03-27 DIAGNOSIS — N879 Dysplasia of cervix uteri, unspecified: Secondary | ICD-10-CM | POA: Diagnosis not present

## 2019-04-06 DIAGNOSIS — K219 Gastro-esophageal reflux disease without esophagitis: Secondary | ICD-10-CM | POA: Diagnosis not present

## 2019-04-06 DIAGNOSIS — E782 Mixed hyperlipidemia: Secondary | ICD-10-CM | POA: Diagnosis not present

## 2019-04-06 DIAGNOSIS — E1169 Type 2 diabetes mellitus with other specified complication: Secondary | ICD-10-CM | POA: Diagnosis not present

## 2019-04-06 DIAGNOSIS — I10 Essential (primary) hypertension: Secondary | ICD-10-CM | POA: Diagnosis not present

## 2019-04-20 DIAGNOSIS — E1169 Type 2 diabetes mellitus with other specified complication: Secondary | ICD-10-CM | POA: Diagnosis not present

## 2019-04-20 DIAGNOSIS — E782 Mixed hyperlipidemia: Secondary | ICD-10-CM | POA: Diagnosis not present

## 2019-06-30 DIAGNOSIS — M79641 Pain in right hand: Secondary | ICD-10-CM | POA: Diagnosis not present

## 2019-11-05 DIAGNOSIS — Z0000 Encounter for general adult medical examination without abnormal findings: Secondary | ICD-10-CM | POA: Diagnosis not present

## 2019-11-05 DIAGNOSIS — E782 Mixed hyperlipidemia: Secondary | ICD-10-CM | POA: Diagnosis not present

## 2019-11-05 DIAGNOSIS — M542 Cervicalgia: Secondary | ICD-10-CM | POA: Diagnosis not present

## 2019-11-05 DIAGNOSIS — E1169 Type 2 diabetes mellitus with other specified complication: Secondary | ICD-10-CM | POA: Diagnosis not present

## 2019-11-05 DIAGNOSIS — I10 Essential (primary) hypertension: Secondary | ICD-10-CM | POA: Diagnosis not present

## 2019-11-10 ENCOUNTER — Ambulatory Visit: Payer: Self-pay | Attending: Internal Medicine

## 2019-11-10 DIAGNOSIS — Z23 Encounter for immunization: Secondary | ICD-10-CM

## 2019-11-10 NOTE — Progress Notes (Signed)
   Covid-19 Vaccination Clinic  Name:  Anita Myers    MRN: [id number] DOB: 08-05-1956  11/10/2019  [name] was observed post Covid-19 immunization for 15 minutes without incident. She was provided with Vaccine Information Sheet and instruction to access the V-Safe system.   [name] was instructed to call 911 with any severe reactions post vaccine: Marland Kitchen Difficulty breathing  . Swelling of face and throat  . A fast heartbeat  . A bad rash all over body  . Dizziness and weakness   Immunizations Administered    Name Date Dose VIS Date Route   Pfizer COVID-19 Vaccine 11/10/2019  9:55 AM 0.3 mL 08/10/2019 Intramuscular   Manufacturer: [employer], [employer]   Lot: [id number]   NDC: [address]-9

## 2019-12-04 ENCOUNTER — Ambulatory Visit: Payer: Self-pay

## 2019-12-04 ENCOUNTER — Ambulatory Visit: Payer: Self-pay | Attending: Internal Medicine

## 2019-12-04 DIAGNOSIS — Z23 Encounter for immunization: Secondary | ICD-10-CM

## 2019-12-04 NOTE — Progress Notes (Signed)
   Covid-19 Vaccination Clinic  Name:  ALISSE TUITE    MRN: [id number] DOB: [birth year]  12/04/2019  Ms. Timmins was observed post Covid-19 immunization for 15 minutes without incident. She was provided with Vaccine Information Sheet and instruction to access the V-Safe system.   Ms. Curl was instructed to call 911 with any severe reactions post vaccine: Marland Kitchen Difficulty breathing  . Swelling of face and throat  . A fast heartbeat  . A bad rash all over body  . Dizziness and weakness   Immunizations Administered    Name Date Dose VIS Date Route   Pfizer COVID-19 Vaccine 12/04/2019  4:21 PM 0.3 mL 08/10/2019 Intramuscular   Manufacturer: [employer], [employer]   Lot: [id number]   NDC: [address]-5

## 2019-12-27 IMAGING — CR DG CHEST 2V
2 series · 2 of 2 positions shown · non-contrast
Comparison: None.

CLINICAL DATA: Erythema nodosum.

EXAM:
CHEST  2 VIEW

[w chest pa]
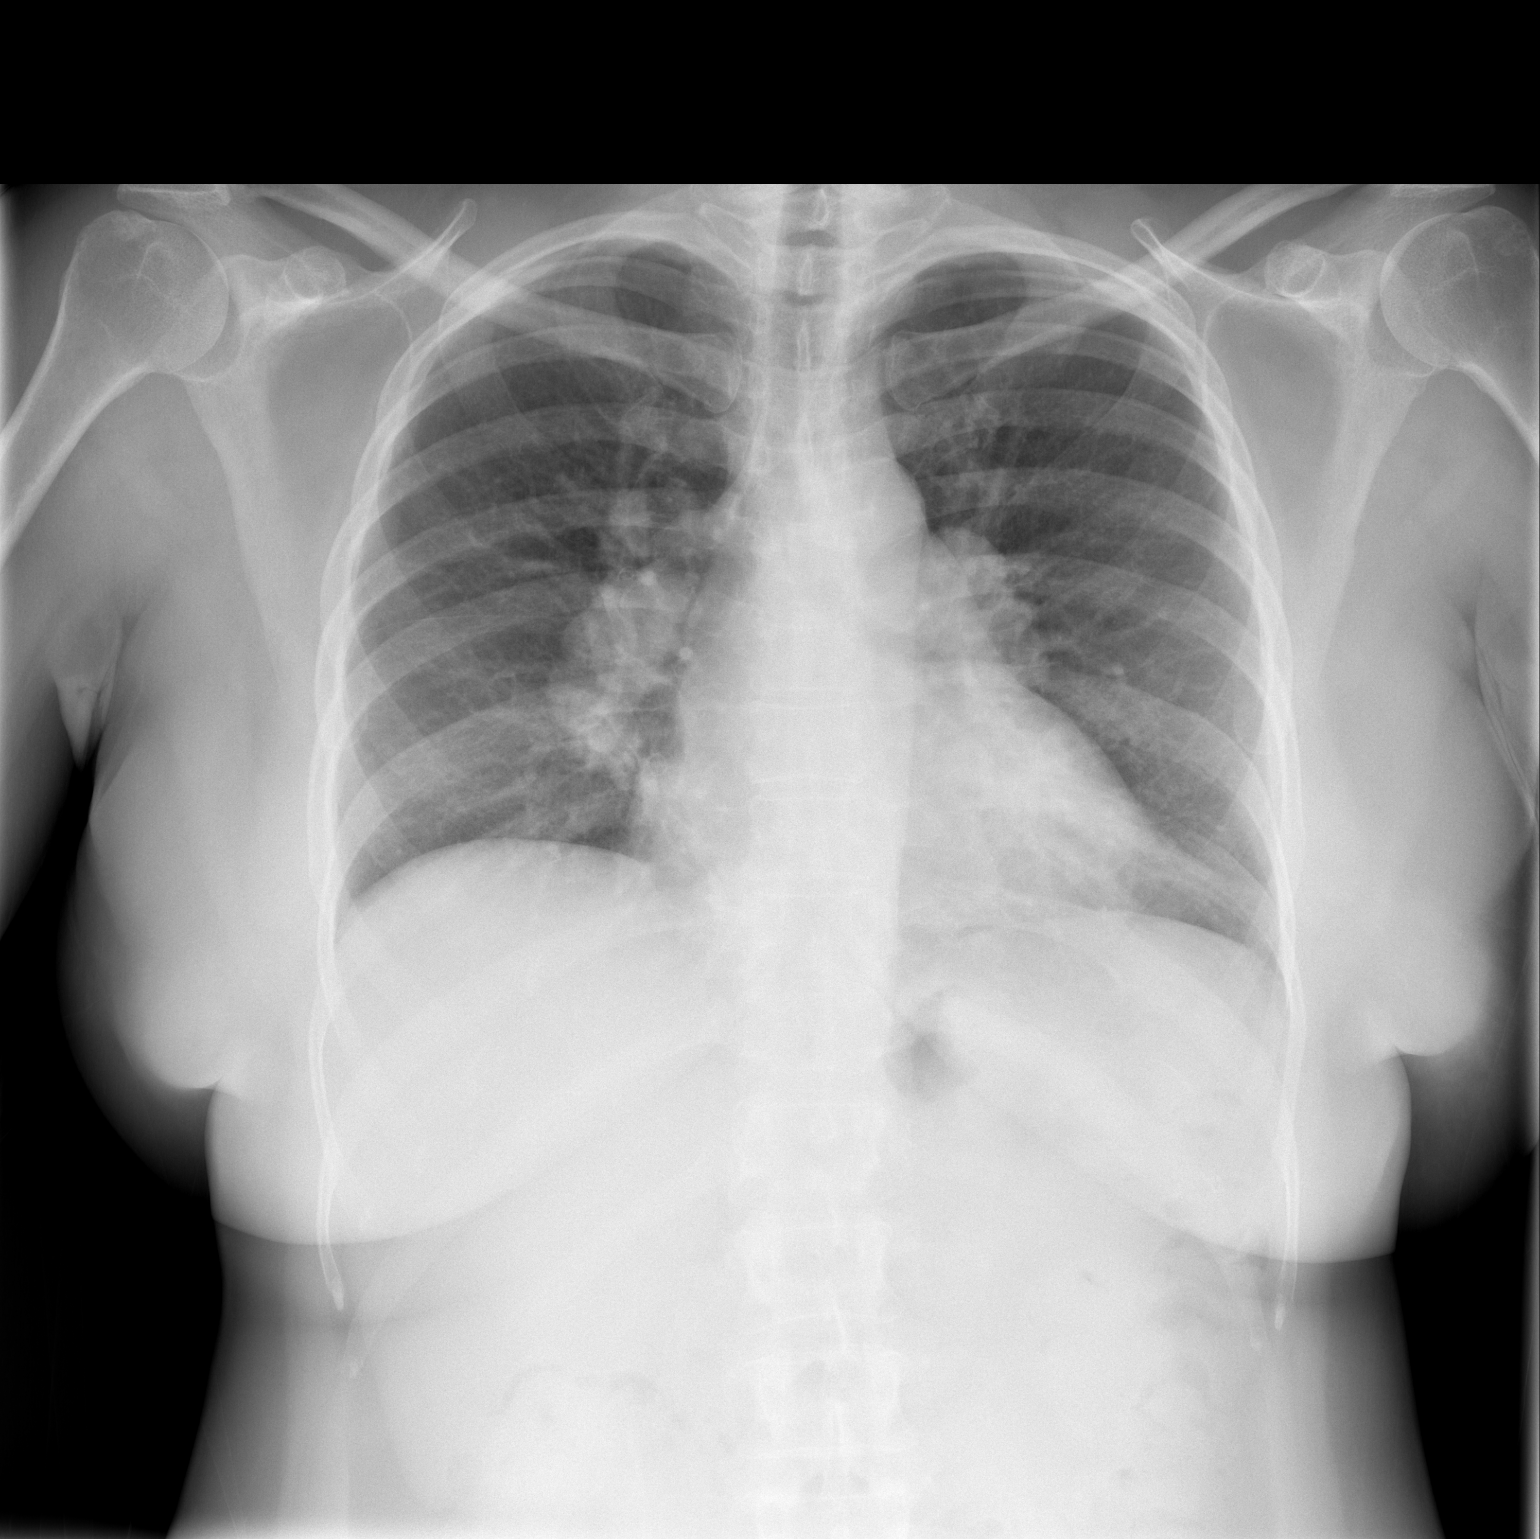

[w chest lat]
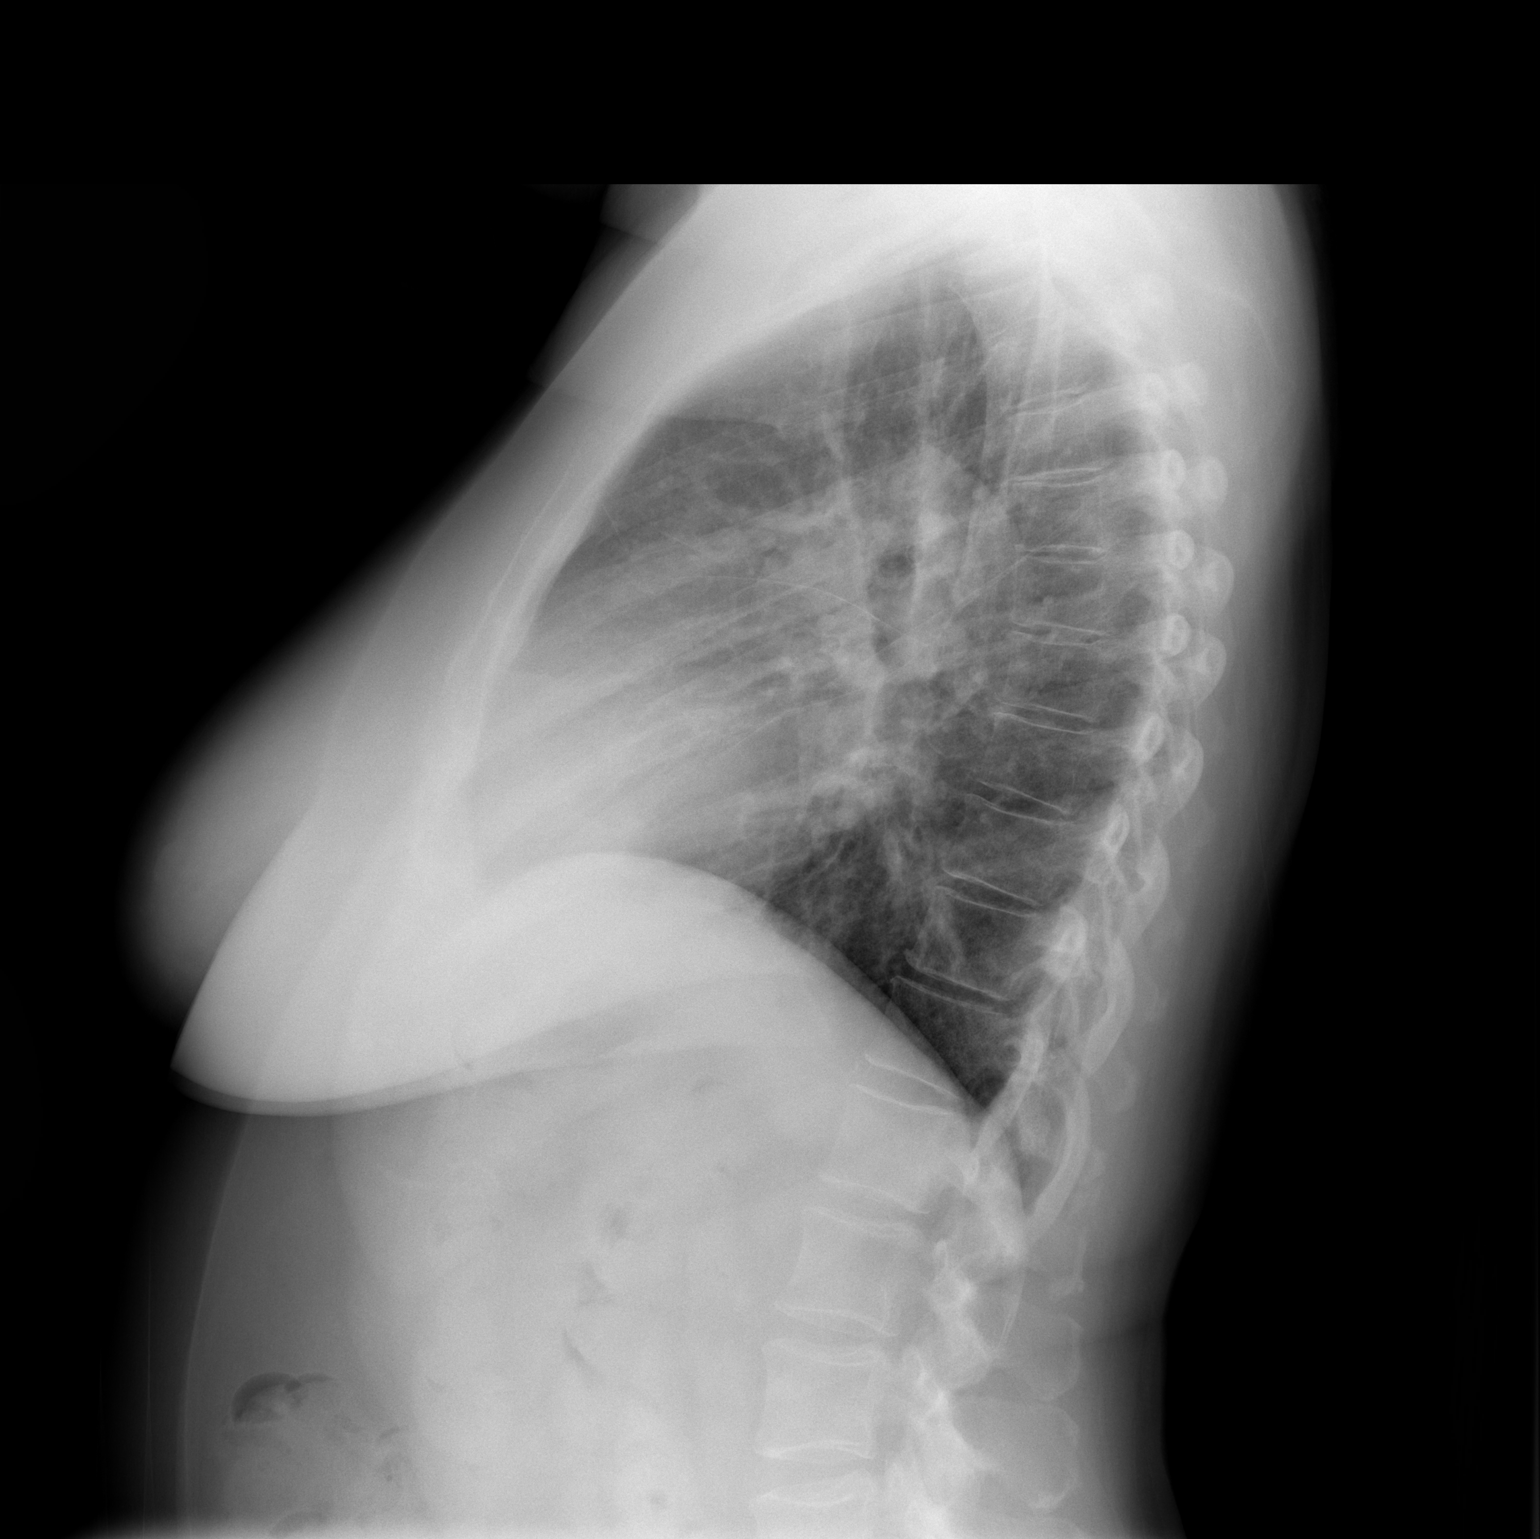

[2 of 2 positions shown; findings below may reference images not displayed]

FINDINGS: Mediastinum is normal. Bilateral hilar fullness noted most
consistent with bilateral hilar adenopathy. Inflammatory,
infectious, and malignant processes including sarcoidosis and
lymphoma could present this fashion. Low lung volumes with bibasilar
atelectasis. No pleural effusion or pneumothorax. Heart size normal.
No acute bony abnormality.
IMPRESSION: 1. Bilateral hilar fullness noted most consistent with bilateral
hilar adenopathy. Inflammatory, infectious, and malignant processes
including sarcoidosis and lymphoma could present in this fashion.
Contrast-enhanced CT of the chest can be obtained for further
evaluation.

2.  Low lung volumes with bibasilar atelectasis.

## 2021-09-18 ENCOUNTER — Encounter (HOSPITAL_BASED_OUTPATIENT_CLINIC_OR_DEPARTMENT_OTHER): Payer: Self-pay

## 2021-09-18 ENCOUNTER — Emergency Department (HOSPITAL_BASED_OUTPATIENT_CLINIC_OR_DEPARTMENT_OTHER)
Admission: EM | Admit: 2021-09-18 | Discharge: 2021-09-19 | Disposition: A | Discharge status: Home / Self Care | Payer: Managed Care, Other (non HMO) | Attending: Emergency Medicine | Admitting: Emergency Medicine

## 2021-09-18 ENCOUNTER — Emergency Department (HOSPITAL_BASED_OUTPATIENT_CLINIC_OR_DEPARTMENT_OTHER): Payer: Managed Care, Other (non HMO)

## 2021-09-18 ENCOUNTER — Other Ambulatory Visit: Payer: Self-pay

## 2021-09-18 DIAGNOSIS — S80211A Abrasion, right knee, initial encounter: Secondary | ICD-10-CM | POA: Diagnosis not present

## 2021-09-18 DIAGNOSIS — R55 Syncope and collapse: Secondary | ICD-10-CM | POA: Diagnosis not present

## 2021-09-18 DIAGNOSIS — S01511A Laceration without foreign body of lip, initial encounter: Secondary | ICD-10-CM | POA: Insufficient documentation

## 2021-09-18 DIAGNOSIS — S80212A Abrasion, left knee, initial encounter: Secondary | ICD-10-CM | POA: Diagnosis not present

## 2021-09-18 DIAGNOSIS — Y9301 Activity, walking, marching and hiking: Secondary | ICD-10-CM | POA: Insufficient documentation

## 2021-09-18 DIAGNOSIS — W19XXXA Unspecified fall, initial encounter: Secondary | ICD-10-CM | POA: Diagnosis not present

## 2021-09-18 DIAGNOSIS — S0993XA Unspecified injury of face, initial encounter: Secondary | ICD-10-CM | POA: Diagnosis present

## 2021-09-18 DIAGNOSIS — Y92481 Parking lot as the place of occurrence of the external cause: Secondary | ICD-10-CM | POA: Insufficient documentation

## 2021-09-18 LAB — BASIC METABOLIC PANEL
Anion gap: 11 (ref 5–15)
BUN: 9 mg/dL (ref 8–23)
CO2: 27 mmol/L (ref 22–32)
Calcium: 10.5 mg/dL — ABNORMAL HIGH (ref 8.9–10.3)
Chloride: 103 mmol/L (ref 98–111)
Creatinine, Ser: 0.64 mg/dL (ref 0.44–1.00)
GFR, Estimated: >60 mL/min (ref >60)
Glucose, Bld: 107 mg/dL — ABNORMAL HIGH (ref 70–99)
Potassium: 3.4 mmol/L — ABNORMAL LOW (ref 3.5–5.1)
Sodium: 141 mmol/L (ref 135–145)

## 2021-09-18 LAB — CBC
HCT: 44 % (ref 36.0–46.0)
Hemoglobin: 13.6 g/dL (ref 12.0–15.0)
MCH: 25.7 pg — ABNORMAL LOW (ref 26.0–34.0)
MCHC: 30.9 g/dL (ref 30.0–36.0)
MCV: 83 fL (ref 80.0–100.0)
Platelets: 264 10*3/uL (ref 150–400)
RBC: 5.3 MIL/uL — ABNORMAL HIGH (ref 3.87–5.11)
RDW: 14.2 % (ref 11.5–15.5)
WBC: 10.8 10*3/uL — ABNORMAL HIGH (ref 4.0–10.5)
nRBC: 0 % (ref 0.0–0.2)

## 2021-09-18 LAB — CBG MONITORING, ED: Glucose-Capillary: 128 mg/dL — ABNORMAL HIGH (ref 70–99)

## 2021-09-18 NOTE — ED Triage Notes (Signed)
Pt states she was leaving work and had a syncopal episode before getting into her car. Pt has abrasions to face and sore knees. Pt reports that she had a head cold for 3 days. Pt states she took an abx that she got from a store in [place] that sold it without a prescription. Pt presented a package that reads Ampicillin 500mg  that contains red and white capsules. These were not obtained from a pharmacy.

## 2021-09-18 NOTE — ED Notes (Signed)
Failed attempt to collect labs   

## 2021-09-19 LAB — MAGNESIUM: Magnesium: 1.8 mg/dL (ref 1.7–2.4)

## 2021-09-19 LAB — TROPONIN I (HIGH SENSITIVITY)
Troponin I (High Sensitivity): 3 ng/L (ref <18)
Troponin I (High Sensitivity): 3 ng/L (ref <18)

## 2021-09-19 MED ORDER — BACITRACIN ZINC 500 UNIT/GM EX OINT
TOPICAL_OINTMENT | Freq: Two times a day (BID) | CUTANEOUS | Status: DC
  Filled 2021-09-19: qty 28.35

## 2021-09-19 NOTE — ED Provider Notes (Signed)
MEDCENTER [facility] EMERGENCY DEPT Provider Note   CSN: [id number] Arrival date & time: 09/18/21  1816     History  Chief Complaint  Patient presents with   Loss of Consciousness    Anita Myers is a 66 y.o. female.  66 year old female who presents emergency department today secondary to a fall.  Patient states that she was walking across the parking lot she remembers waving somebody and then continue to walk.  she remembers starting to fall but does not remember actually hitting the ground.  The next thing that she remembers is sitting up crosslegged.  She states that she had hurt her right knee and left knee as well in the fall but also busted her lip and has a goose egg on her head and abrasion to her left cheek.  She states that she had no recent documented illness but she did have some runny nose and upper respiratory type symptoms so she took some nonprescription ampicillin from a [place] store to see if it would help.  She went took 1 of those doses.  She also been taking Mucinex DM.  She also has had a normal amount of caffeine recently.  She did not have any preceding symptoms such as lightheadedness, headache, chest pain, palpitations, back pain, blurry vision or other symptoms.  She feels normal now aside from her trauma injuries.  She also felt pretty normal shortly after the event.  She is not sure how long she was out but she does not think was very long.  She has been compliant with her home medications.  She does have strong family cardiac history including 2 sisters with cardiac disease and a father who had at a young age.  No history of sudden death in the family.   Loss of Consciousness     Home Medications Prior to Admission medications   Medication Sig Start Date End Date Taking? Authorizing Provider  atorvastatin (LIPITOR) 20 MG tablet Take 20 mg by mouth daily.    [provider]  loratadine (CLARITIN) 10 MG tablet Take 10 mg by mouth daily.     [provider]  losartan (COZAAR) 100 MG tablet Take 1 tablet by mouth daily.    [provider]  metFORMIN (GLUCOPHAGE) 1000 MG tablet Take 1,000 mg by mouth 2 (two) times daily with a meal.    [provider]  predniSONE (DELTASONE) 10 MG tablet 2 tabs daily X10 days, then 1 tab daily X10 days, then stop. 12/13/17   Lupita Leash, MD      Allergies    Sulfa antibiotics and Zovirax [acyclovir]    Review of Systems   Review of Systems  Cardiovascular:  Positive for syncope.   Physical Exam Updated Vital Signs BP (!) 148/86    Pulse 64    Temp 97.6 F (36.4 C)    Resp 12    Ht 5\' 4"  (1.626 m)    Wt 73.9 kg    SpO2 100%    BMI 27.98 kg/m  Physical Exam Vitals and nursing note reviewed.  Constitutional:      Appearance: She is well-developed.  HENT:     Head: Normocephalic.     Comments: Abrasion and contusion to her left forehead, abrasion to her left anterior cheek and left lateral cheek, swelling and abrasions to her lower lip and inner lip has a small superficial laceration    Mouth/Throat:     Mouth: Mucous membranes are moist.  Pharynx: Oropharynx is clear.  Eyes:     Pupils: Pupils are equal, round, and reactive to light.  Cardiovascular:     Rate and Rhythm: Normal rate and regular rhythm.  Pulmonary:     Effort: No respiratory distress.     Breath sounds: No stridor.  Abdominal:     General: There is no distension.  Musculoskeletal:     Cervical back: Normal range of motion.  Skin:    General: Skin is warm.     Comments: Abrasions to both knees greater on the right with mild edema but no obvious deformity  Neurological:     Mental Status: She is alert.    ED Results / Procedures / Treatments   Labs (all labs ordered are listed, but only abnormal results are displayed) Labs Reviewed  BASIC METABOLIC PANEL - Abnormal; Notable for the following components:      Result Value   Potassium 3.4 (*)    Glucose, Bld 107 (*)     Calcium 10.5 (*)    All other components within normal limits  CBC - Abnormal; Notable for the following components:   WBC 10.8 (*)    RBC 5.30 (*)    MCH 25.7 (*)    All other components within normal limits  CBG MONITORING, ED - Abnormal; Notable for the following components:   Glucose-Capillary 128 (*)    All other components within normal limits  MAGNESIUM  URINALYSIS, ROUTINE W REFLEX MICROSCOPIC  CBG MONITORING, ED  TROPONIN I (HIGH SENSITIVITY)  TROPONIN I (HIGH SENSITIVITY)    EKG EKG Interpretation  Date/Time:  Friday September 18 2021 18:30:02 EST Ventricular Rate:  99 PR Interval:  124 QRS Duration: 76 QT Interval:  322 QTC Calculation: 413 R Axis:   70 Text Interpretation: Normal sinus rhythm T wave abnormality, consider inferolateral ischemia Abnormal ECG When compared with ECG of 12-Oct-2007 11:50, Inverted T waves have replaced nonspecific T wave abnormality in Inferior leads Inverted T waves have replaced nonspecific T wave abnormality in Anterolateral leads Confirmed by Edwin DadaGray, Alicia ([id number]) on 09/18/2021 8:19:07 PM  Radiology CT Head Wo Contrast  Result Date: 09/19/2021 CLINICAL DATA:  Syncopal episode. EXAM: CT HEAD WITHOUT CONTRAST TECHNIQUE: Contiguous axial images were obtained from the base of the skull through the vertex without intravenous contrast. RADIATION DOSE REDUCTION: This exam was performed according to the departmental dose-optimization program which includes automated exposure control, adjustment of the mA and/or kV according to patient size and/or use of iterative reconstruction technique. COMPARISON:  None. FINDINGS: Brain: No evidence of acute infarction, hemorrhage, hydrocephalus, extra-axial collection or mass lesion/mass effect. Vascular: No hyperdense vessel or unexpected calcification. Skull: Normal. Negative for fracture or focal lesion. Sinuses/Orbits: No acute finding. Other: Very mild left frontal scalp soft tissue swelling is seen. IMPRESSION:  1. No acute intracranial abnormality. 2. Very mild left frontal scalp soft tissue swelling. Electronically Signed   By: Aram Candelahaddeus  Houston M.D.   On: 09/19/2021 00:38   CT Maxillofacial Wo Contrast  Result Date: 09/19/2021 CLINICAL DATA:  Syncopal episode. EXAM: CT MAXILLOFACIAL WITHOUT CONTRAST TECHNIQUE: Multidetector CT imaging of the maxillofacial structures was performed. Multiplanar CT image reconstructions were also generated. RADIATION DOSE REDUCTION: This exam was performed according to the departmental dose-optimization program which includes automated exposure control, adjustment of the mA and/or kV according to patient size and/or use of iterative reconstruction technique. COMPARISON:  None. FINDINGS: Osseous: No fracture or mandibular dislocation. No destructive process. Orbits: Negative. No traumatic or inflammatory finding. Sinuses:  Clear. Soft tissues: Negative. Limited intracranial: There is very mild left frontal scalp soft tissue swelling. IMPRESSION: 1. No acute facial bone fracture. 2. Very mild left frontal scalp soft tissue swelling. Electronically Signed   By: Aram Candelahaddeus  Houston M.D.   On: 09/19/2021 00:39    Procedures .1-3 Lead EKG Interpretation Performed by: Marily MemosMesner, Britini Garcilazo, MD Authorized by: Marily MemosMesner, Copelyn Widmer, MD     Interpretation: normal     ECG rate:  63   ECG rate assessment: normal     Rhythm: sinus rhythm     Ectopy: none     Conduction: normal      Medications Ordered in ED Medications  bacitracin ointment ( Topical Given 09/19/21 0235)    ED Course/ Medical Decision Making/ A&P Clinical Course as of 09/19/21 0417  Sat Sep 19, 2021  [id number] 66 year old female who presents emerged from today secondary to a fall.  Patient states that she was walking across the parking lot she remembers waving somebody and then continue to walk.  she remembers starting to fall but does not remember actually hitting the ground.  The next thing that she remembers is sitting up  crosslegged.  She states that she had hurt her right knee and left knee as well in the fall but also busted her lip and has a goose egg on her head and abrasion to her left cheek.  She states that she had no recent documented illness but she did have some runny nose and upper respiratory type symptoms so she took some nonprescription ampicillin from a [place] store to see if it would help.  She went took 1 of those doses.  She also been taking Mucinex DM.  She also has had a normal amount of caffeine recently.  She did not have any preceding symptoms such as lightheadedness, headache, chest pain, palpitations, back pain, blurry vision or other symptoms.  She feels normal now aside from her trauma injuries.  She also felt pretty normal shortly after the event.  She is not sure how long she was out but she does not think was very long.  She has been compliant with her home medications.  She does have strong family cardiac history including 2 sisters with cardiac disease and a father who had at a young age.  No history of sudden death in the family. [JM]  0028 BP(!): 164/92 Slightly high but seems to be improving with time [JM]  0028 Potassium(!): 3.4 Mildly low but unlikely to be related to her symptoms. [JM]  0028 Sodium: 141 [JM]  0028 Creatinine: 0.64 Not dehydrated [JM]  0028 Hemoglobin: 13.6 Not anemic [JM]  0028 Glucose-Capillary(!): 128 Reassuring given her history of diabetes [JM]  0028 Magnesium: 1.8 Normal doubt any relationship to her fall [JM]  0028 EKG 12-Lead Different than the only other when I can see in the system from 2009 but no acute changes.  Does have some Q waves and some T wave inversions concerning for possible prior infarct. [JM]  0029 It is difficult to ascertain if patient had a syncopal or near syncopal episode causing her to fall and hit her face that she does not have any injuries to her hands or any other defensive wounds. [JM]  0029   She does have some recollection of  falling but then has loss of memory between that memory and waking up [place] cross ligated.  So could also be anterograde memory loss related to a head injury.  I will work-up for syncopal episode  also to include trauma imaging. [JM]    Clinical Course User Index [JM] Kanav Kazmierczak, Barbara Cower, MD                           Medical Decision Making Amount and/or Complexity of Data Reviewed Labs: ordered. Decision-making details documented in ED Course. Radiology: ordered. ECG/medicine tests:  Decision-making details documented in ED Course.  Risk OTC drugs.   Patient is work-up overall unremarkable.  Unclear etiology for her likely syncopal/near syncopal episode.  We will follow-up with her primary doctor for further work-up of the same.  Wound care per nursing and patient will continue wound care at home.  Also discussed postconcussive syndrome and what to look out for.  Final Clinical Impression(s) / ED Diagnoses Final diagnoses:  Syncope, unspecified syncope type    Rx / DC Orders ED Discharge Orders     None         Milea Klink, Barbara Cower, MD 09/19/21 [phone]

## 2021-10-23 NOTE — Progress Notes (Signed)
CARDIOLOGY CONSULT NOTE       Patient ID: Anita Myers MRN: [id number] DOB/AGE: 02-Jun-1956 66 y.o.  Admit date: (Not on file) Referring Physician: [facility] [place]  Primary Physician: Antony Contras, MD Primary Cardiologist: New Reason for Consultation: Syncope  Active Problems:   * No active hospital problems. *   HPI:  66 y.o. referred by ER DR Mesner for LOC/syncope Seen at [facility] 09/18/21 Was walking in parking lot and saying hello to person and fell to ground Had some head/left cheek trauma and bruised both knees Had URI and took some OTC ampicillin from [place] store and Mucinex DM No preceding chest pain, dyspnea palpitations dizziness seizure activity 2 sisters with premature CAD and father with heart dx at young age CRF;s DM, HTN and HLD CT head and sinus ok mild scalp soft tissue swelling Labs ok r/o   ECG  SR LVH with strain normal QT no arrhythmias  Has seen McQuaid in pulmonary 2019 for mediastinal lymphadenopathy and 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  Erythema nodosum bothers her legs referred to derm Steroids Rx with steroids in past Concern that this would worsen her DM Also used topical steroids   Long discussion about cardiac sarcoid and bradycardia/heart block Also discussed fact she should not be driving for 6 months based on syncope  She had a niece with sarcoid who passed from drugs / sarcoid  No high risk arrhythmic family history   She has one other syncopal episode out of the blue 1.5 years ago    ROS All other systems reviewed and negative except as noted above  Past Medical History:  Diagnosis Date   Allergy    Diabetes mellitus without complication ([place])    Hyperlipidemia    Hypertension     Family History  Problem Relation Age of Onset   Diabetes Mother    Hypertension Mother    Diabetes Father    Hypertension Father    Diabetes Sister    Hypertension Sister    Hypertension Brother     Hypertension Maternal Grandmother    Diabetes Maternal Grandmother    Cancer Maternal Grandmother    Cancer Maternal Grandfather     Social History   Socioeconomic History   Marital status: Legally Separated    Spouse name: Not on file   Number of children: Not on file   Years of education: Not on file   Highest education level: Not on file  Occupational History   Not on file  Tobacco Use   Smoking status: Never   Smokeless tobacco: Never  Vaping Use   Vaping Use: Never used  Substance and Sexual Activity   Alcohol use: Never   Drug use: Never   Sexual activity: Not on file  Other Topics Concern   Not on file  Social History Narrative   Not on file   Social Determinants of Health   Financial Resource Strain: Not on file  Food Insecurity: Not on file  Transportation Needs: Not on file  Physical Activity: Not on file  Stress: Not on file  Social Connections: Not on file  Intimate Partner Violence: Not on file    Past Surgical History:  Procedure Laterality Date   ABDOMINAL HYSTERECTOMY        Current Outpatient Medications:    atorvastatin (LIPITOR) 20 MG tablet, Take 20 mg by mouth daily., Disp: , Rfl:    loratadine (CLARITIN) 10 MG tablet, Take 10 mg by mouth daily., Disp: ,  Rfl:    losartan (COZAAR) 100 MG tablet, Take 1 tablet by mouth daily., Disp: , Rfl:    metFORMIN (GLUCOPHAGE) 1000 MG tablet, Take 1,000 mg by mouth 2 (two) times daily with a meal., Disp: , Rfl:    predniSONE (DELTASONE) 10 MG tablet, 2 tabs daily X10 days, then 1 tab daily X10 days, then stop., Disp: 30 tablet, Rfl: 0    Physical Exam: There were no vitals taken for this visit.    Affect appropriate Healthy:  appears stated age 27: normal Neck supple with no adenopathy JVP normal no bruits no thyromegaly Lungs clear with no wheezing and good diaphragmatic motion Heart:  S1/S2 no murmur, no rub, gallop or click PMI normal Abdomen: benighn, BS positve, no tenderness, no AAA no  bruit.  No HSM or HJR Distal pulses intact with no bruits No edema Neuro non-focal Skin warm and dry No muscular weakness   Labs:   Lab Results  Component Value Date   WBC 10.8 (H) 09/18/2021   HGB 13.6 09/18/2021   HCT 44.0 09/18/2021   MCV 83.0 09/18/2021   PLT 264 09/18/2021   No results for input(s): NA, K, CL, CO2, BUN, CREATININE, CALCIUM, PROT, BILITOT, ALKPHOS, ALT, AST, GLUCOSE in the last 168 hours.  Invalid input(s): LABALBU No results found for: CKTOTAL, CKMB, CKMBINDEX, TROPONINI No results found for: CHOL No results found for: HDL No results found for: LDLCALC No results found for: TRIG No results found for: CHOLHDL No results found for: LDLDIRECT    Radiology: No results found.  EKG: See HPI   ASSESSMENT AND PLAN:   Syncope/LOC:  etiology unclear CRFls and abnormal ECG Recommend cardiac CTA And cardiac MRI given history of sarcoid and risk of cardiac involvement with AV block  HTN:  Well controlled.  Continue current medications and low sodium Dash type diet.   HLD:  continue statin labs with primary DM:  Discussed low carb diet.  Target hemoglobin A1c is 6.5 or less.  Continue current medications. Pulmonary Sarcoid:  f/u [facility] PFTs in past showed mild restriction should likely have f/u CT chest last one done in 2019 Should be able to evaluate mediastinum on cardiac scan Erythema Nodosum:  pain in legs  f/u derm good pedal pulses   Cardiac CTA Cardiac MRI 30 day monitor   F/U cardiology PRN  Signed: Jenkins Rouge 10/23/2021, 5:55 PM

## 2021-10-27 ENCOUNTER — Encounter: Payer: Self-pay | Admitting: Cardiovascular Disease

## 2021-10-27 ENCOUNTER — Ambulatory Visit: Payer: Managed Care, Other (non HMO) | Admitting: Cardiovascular Disease

## 2021-10-27 ENCOUNTER — Ambulatory Visit (INDEPENDENT_AMBULATORY_CARE_PROVIDER_SITE_OTHER): Payer: Managed Care, Other (non HMO)

## 2021-10-27 ENCOUNTER — Other Ambulatory Visit: Payer: Self-pay

## 2021-10-27 VITALS — BP 114/60 | HR 85 | Ht 64.0 in | Wt 168.0 lb

## 2021-10-27 DIAGNOSIS — E785 Hyperlipidemia, unspecified: Secondary | ICD-10-CM

## 2021-10-27 DIAGNOSIS — R55 Syncope and collapse: Secondary | ICD-10-CM

## 2021-10-27 DIAGNOSIS — I10 Essential (primary) hypertension: Secondary | ICD-10-CM

## 2021-10-27 DIAGNOSIS — D860 Sarcoidosis of lung: Secondary | ICD-10-CM | POA: Diagnosis not present

## 2021-10-27 DIAGNOSIS — D8685 Sarcoid myocarditis: Secondary | ICD-10-CM

## 2021-10-27 MED ORDER — METOPROLOL TARTRATE 100 MG PO TABS: ORAL_TABLET | ORAL | 0 refills | Status: AC

## 2021-10-27 NOTE — Patient Instructions (Addendum)
Medication Instructions:  Your physician recommends that you continue on your current medications as directed. Please refer to the Current Medication list given to you today.  *If you need a refill on your cardiac medications before your next appointment, please call your pharmacy*  Lab Work: Your physician recommends that you have lab work today- BMET and CBC  If you have labs (blood work) drawn today and your tests are completely normal, you will receive your results only by: MyChart Message (if you have MyChart) OR A paper copy in the mail If you have any lab test that is abnormal or we need to change your treatment, we will call you to review the results.   Testing/Procedures: Your physician has recommended that you wear an event monitor. Event monitors are medical devices that record the hearts electrical activity. Doctors most often [place] these monitors to diagnose arrhythmias. Arrhythmias are problems with the speed or rhythm of the heartbeat. The monitor is a small, portable device. You can wear one while you do your normal daily activities. This is usually used to diagnose what is causing palpitations/syncope (passing out).  Your physician has requested that you have cardiac CT. Cardiac computed tomography (CT) is a painless test that uses an x-ray machine to take clear, detailed pictures of your heart. For further information please visit HugeFiesta.[email or web address]. Please follow instruction sheet as given.  Your physician has requested that you have a cardiac MRI. Cardiac MRI uses a computer to create images of your heart as its beating, producing both still and moving pictures of your heart and major blood vessels. For further information please visit [email or web address]-[email or web address] Please follow the instruction sheet given to you today for more information.   Follow-Up: At [facility], you and your health needs are our priority.  As part of our continuing mission to provide you with exceptional  heart care, we have created designated Provider Care Teams.  These Care Teams include your primary Cardiologist (physician) and Advanced Practice Providers (APPs -  Physician Assistants and Nurse Practitioners) who all work together to provide you with the care you need, when you need it.  We recommend signing up for the patient portal called "MyChart".  Sign up information is provided on this After Visit Summary.  MyChart is used to connect with patients for Virtual Visits (Telemedicine).  Patients are able to view lab/test results, encounter notes, upcoming appointments, etc.  Non-urgent messages can be sent to your provider as well.   To learn more about what you can do with MyChart, go to [email or web address].    Your next appointment:   As needed  The format for your next appointment:   In Person  Provider:   Jenkins Rouge, MD {   Other Instructions   Your cardiac CT will be scheduled at one of the below locations:   [facility] [address] [place], [place] [address] [phone]  If scheduled at [facility], please arrive at the [facility] and Children's Entrance (Entrance C2) of [facility] 30 minutes prior to test start time. You can use the FREE valet parking offered at entrance C (encouraged to control the heart rate for the test)  Proceed to the [facility] Radiology Department (first floor) to check-in and test prep.  All radiology patients and guests should use entrance C2 at [facility], accessed from [facility], even though the hospital's physical address listed is [address]      Please follow these  instructions carefully (unless otherwise directed):   On the Night Before the Test: Be sure to Drink plenty of water. Do not consume any caffeinated/decaffeinated beverages or chocolate 12 hours prior to your test. Do not take any antihistamines 12 hours prior to your test.  On the Day of the  Test: Drink plenty of water until 1 hour prior to the test. Do not eat any food 4 hours prior to the test. You may take your regular medications prior to the test.  Take metoprolol (Lopressor) 100 mg two hours prior to test. FEMALES- please wear underwire-free bra if available, avoid dresses & tight clothing  After the Test: Drink plenty of water. After receiving IV contrast, you may experience a mild flushed feeling. This is normal. On occasion, you may experience a mild rash up to 24 hours after the test. This is not dangerous. If this occurs, you can take Benadryl 25 mg and increase your fluid intake. If you experience trouble breathing, this can be serious. If it is severe call 911 IMMEDIATELY. If it is mild, please call our office. If you take any of these medications: Metformin please do not take 48 hours after completing test unless otherwise instructed.  We will call to schedule your test 2-4 weeks out understanding that some insurance companies will need an authorization prior to the service being performed.   For non-scheduling related questions, please contact the cardiac imaging nurse navigator should you have any questions/concerns: Marchia Bond, Cardiac Imaging Nurse Navigator Gordy Clement, Cardiac Imaging Nurse Navigator [facility] Direct Office Dial: [phone]   For scheduling needs, including cancellations and rescheduling, please call [place], [phone].

## 2021-10-27 NOTE — Progress Notes (Unsigned)
[id number] from office inventory ( dropped off by Mitch at [facility] when monitor shipment did not arrive)applied to patient

## 2021-10-28 LAB — BASIC METABOLIC PANEL
BUN/Creatinine Ratio: 14 (ref 12–28)
BUN: 10 mg/dL (ref 8–27)
CO2: 21 mmol/L (ref 20–29)
Calcium: 10.1 mg/dL (ref 8.7–10.3)
Chloride: 103 mmol/L (ref 96–106)
Creatinine, Ser: 0.71 mg/dL (ref 0.57–1.00)
Glucose: 122 mg/dL — ABNORMAL HIGH (ref 70–99)
Potassium: 4 mmol/L (ref 3.5–5.2)
Sodium: 143 mmol/L (ref 134–144)
eGFR: 95 mL/min/{1.73_m2} (ref >59)

## 2021-10-28 LAB — CBC
Hematocrit: 37 % (ref 34.0–46.6)
Hemoglobin: 11.8 g/dL (ref 11.1–15.9)
MCH: 25.3 pg — ABNORMAL LOW (ref 26.6–33.0)
MCHC: 31.9 g/dL (ref 31.5–35.7)
MCV: 79 fL (ref 79–97)
Platelets: 247 10*3/uL (ref 150–450)
RBC: 4.66 x10E6/uL (ref 3.77–5.28)
RDW: 13.1 % (ref 11.7–15.4)
WBC: 9 10*3/uL (ref 3.4–10.8)

## 2021-11-20 ENCOUNTER — Telehealth: Payer: Self-pay | Admitting: Cardiovascular Disease

## 2021-11-20 NOTE — Telephone Encounter (Signed)
Patient is calling wanting to know if there is more adhesive tape for  her heart monitor. She has to wear it until next Thursday and has a hard time getting it to stay on. She also asked if she was able to use the tape used for gauzes to keep it on.  ?

## 2021-11-20 NOTE — Telephone Encounter (Signed)
Returned call to patient. I explained she can try to use tape but if that doesn't work go ahead and mail monitor back a few days early.  ?

## 2021-11-27 ENCOUNTER — Telehealth: Payer: Self-pay | Admitting: Cardiovascular Disease

## 2021-11-27 NOTE — Telephone Encounter (Signed)
Pt has a question about cancelled procedure as well as upcoming procedure.. please advise  ?

## 2021-11-27 NOTE — Telephone Encounter (Signed)
Spoke with the patient who states that her cardiac MRI was cancelled because it was denied by her insurance. Patient is also scheduled for a cardiac CT which I have advised her to keep that appointment. She would like to know if she needs something else done in replace of the MRI or if the CT scan is all that is needed.  ?

## 2021-11-30 NOTE — Telephone Encounter (Signed)
?  Per Dr. Eden Emms, MRI was being ordered for cardiac sarcoid. She has had syncope and known pulmonary sarcoid Make sure the order was placed for correct indication there is no reason to deny it for that indication. ? ? ?Patient's ordering diagnosis for MRI was pulmonary sarcoidosis and syncope. Added cardiac sarcoid to patient's order. Will send message to pre auth to help with approval.  ?

## 2021-12-01 ENCOUNTER — Ambulatory Visit (HOSPITAL_COMMUNITY): Payer: Managed Care, Other (non HMO)

## 2021-12-17 ENCOUNTER — Ambulatory Visit (HOSPITAL_COMMUNITY): Payer: Managed Care, Other (non HMO)

## 2021-12-18 ENCOUNTER — Telehealth (HOSPITAL_COMMUNITY): Payer: Self-pay | Admitting: *Deleted

## 2021-12-18 NOTE — Telephone Encounter (Signed)
Reaching out to patient to offer assistance regarding upcoming cardiac imaging study; pt verbalizes understanding of appt date/time, parking situation and where to check in, pre-test NPO status and medications ordered, and verified current allergies; name and call back number provided for further questions should they arise ? ?Maryanna Stuber RN Navigator Cardiac Imaging ?[facility] ?[phone] office ?[phone] cell ? ?Patient to take 100mg metoprolol tartrate two hours prior to cardiac CT scan. She is aware to arrive at 10:30am. ?

## 2021-12-21 ENCOUNTER — Ambulatory Visit (HOSPITAL_COMMUNITY)
Admission: RE | Admit: 2021-12-21 | Discharge: 2021-12-21 | Disposition: A | Discharge status: Home / Self Care | Payer: Managed Care, Other (non HMO) | Source: Ambulatory Visit | Attending: Cardiovascular Disease | Admitting: Cardiovascular Disease

## 2021-12-21 DIAGNOSIS — D8685 Sarcoid myocarditis: Secondary | ICD-10-CM | POA: Insufficient documentation

## 2021-12-21 DIAGNOSIS — R55 Syncope and collapse: Secondary | ICD-10-CM | POA: Insufficient documentation

## 2021-12-21 DIAGNOSIS — D860 Sarcoidosis of lung: Secondary | ICD-10-CM | POA: Diagnosis present

## 2021-12-21 DIAGNOSIS — E785 Hyperlipidemia, unspecified: Secondary | ICD-10-CM | POA: Insufficient documentation

## 2021-12-21 DIAGNOSIS — I10 Essential (primary) hypertension: Secondary | ICD-10-CM | POA: Diagnosis present

## 2021-12-21 MED ORDER — NITROGLYCERIN 0.4 MG SL SUBL
SUBLINGUAL_TABLET | SUBLINGUAL | Status: AC
  Filled 2021-12-21: qty 2

## 2021-12-21 MED ORDER — IOHEXOL 350 MG/ML SOLN
95.0000 mL | Freq: Once | INTRAVENOUS | Status: AC | PRN
  Administered 2021-12-21: 95 mL via INTRAVENOUS

## 2021-12-21 MED ORDER — NITROGLYCERIN 0.4 MG SL SUBL
0.8000 mg | SUBLINGUAL_TABLET | Freq: Once | SUBLINGUAL | Status: AC
  Administered 2021-12-21: 0.8 mg via SUBLINGUAL

## 2022-01-07 ENCOUNTER — Other Ambulatory Visit (HOSPITAL_COMMUNITY): Payer: Managed Care, Other (non HMO)

## 2022-01-11 ENCOUNTER — Telehealth (HOSPITAL_COMMUNITY): Payer: Self-pay | Admitting: *Deleted

## 2022-01-11 NOTE — Telephone Encounter (Signed)
Attempted to call patient regarding upcoming cardiac MRI appointment. Left message on voicemail with name and callback number  Traylon Schimming RN Navigator Cardiac Imaging [facility] [phone] Office [phone] Cell  

## 2022-01-12 ENCOUNTER — Ambulatory Visit (HOSPITAL_COMMUNITY)
Admission: RE | Admit: 2022-01-12 | Discharge: 2022-01-12 | Disposition: A | Discharge status: Home / Self Care | Payer: Managed Care, Other (non HMO) | Source: Ambulatory Visit | Attending: Cardiovascular Disease | Admitting: Cardiovascular Disease

## 2022-01-12 ENCOUNTER — Telehealth (HOSPITAL_COMMUNITY): Payer: Self-pay | Admitting: Emergency Medicine

## 2022-01-12 DIAGNOSIS — D8685 Sarcoid myocarditis: Secondary | ICD-10-CM | POA: Diagnosis present

## 2022-01-12 DIAGNOSIS — D860 Sarcoidosis of lung: Secondary | ICD-10-CM | POA: Insufficient documentation

## 2022-01-12 DIAGNOSIS — R55 Syncope and collapse: Secondary | ICD-10-CM

## 2022-01-12 DIAGNOSIS — I10 Essential (primary) hypertension: Secondary | ICD-10-CM | POA: Insufficient documentation

## 2022-01-12 DIAGNOSIS — E785 Hyperlipidemia, unspecified: Secondary | ICD-10-CM | POA: Diagnosis present

## 2022-01-12 MED ORDER — GADOBUTROL 1 MMOL/ML IV SOLN
8.0000 mL | Freq: Once | INTRAVENOUS | Status: AC | PRN
  Administered 2022-01-12: 8 mL via INTRAVENOUS

## 2022-01-12 NOTE — Telephone Encounter (Signed)
Reaching out to patient to offer assistance regarding upcoming cardiac imaging study; pt verbalizes understanding of appt date/time, parking situation and where to check in, pre-test NPO status and medications ordered, and verified current allergies; name and call back number provided for further questions should they arise ?Rockwell Alexandria RN Navigator Cardiac Imaging ?[facility] ?[phone] office ?[phone] cell ? ?Denies claustro ?Denies iv issues ?Denies metal implants ?Arrival 330 ?

## 2023-02-28 ENCOUNTER — Other Ambulatory Visit: Payer: Self-pay | Admitting: Obstetrics and Gynecology

## 2023-02-28 DIAGNOSIS — R928 Other abnormal and inconclusive findings on diagnostic imaging of breast: Secondary | ICD-10-CM

## 2023-03-07 ENCOUNTER — Ambulatory Visit
Admission: RE | Admit: 2023-03-07 | Discharge: 2023-03-07 | Disposition: A | Discharge status: Home / Self Care | Payer: BC Managed Care – PPO | Source: Ambulatory Visit | Attending: Obstetrics and Gynecology | Admitting: Obstetrics and Gynecology

## 2023-03-07 ENCOUNTER — Ambulatory Visit: Payer: Managed Care, Other (non HMO)

## 2023-03-07 DIAGNOSIS — N6310 Unspecified lump in the right breast, unspecified quadrant: Secondary | ICD-10-CM | POA: Diagnosis not present

## 2023-03-07 DIAGNOSIS — R928 Other abnormal and inconclusive findings on diagnostic imaging of breast: Secondary | ICD-10-CM

## 2023-04-18 DIAGNOSIS — R3129 Other microscopic hematuria: Secondary | ICD-10-CM | POA: Diagnosis not present

## 2023-06-16 DIAGNOSIS — L309 Dermatitis, unspecified: Secondary | ICD-10-CM | POA: Diagnosis not present

## 2023-06-16 DIAGNOSIS — E782 Mixed hyperlipidemia: Secondary | ICD-10-CM | POA: Diagnosis not present

## 2023-06-16 DIAGNOSIS — Z23 Encounter for immunization: Secondary | ICD-10-CM | POA: Diagnosis not present

## 2023-06-16 DIAGNOSIS — I10 Essential (primary) hypertension: Secondary | ICD-10-CM | POA: Diagnosis not present

## 2023-06-16 DIAGNOSIS — E1169 Type 2 diabetes mellitus with other specified complication: Secondary | ICD-10-CM | POA: Diagnosis not present

## 2023-06-16 DIAGNOSIS — Z0000 Encounter for general adult medical examination without abnormal findings: Secondary | ICD-10-CM | POA: Diagnosis not present

## 2023-06-27 DIAGNOSIS — I872 Venous insufficiency (chronic) (peripheral): Secondary | ICD-10-CM | POA: Diagnosis not present

## 2023-08-03 DIAGNOSIS — Z1211 Encounter for screening for malignant neoplasm of colon: Secondary | ICD-10-CM | POA: Diagnosis not present

## 2023-08-03 DIAGNOSIS — K5730 Diverticulosis of large intestine without perforation or abscess without bleeding: Secondary | ICD-10-CM | POA: Diagnosis not present

## 2023-08-22 DIAGNOSIS — A5900 Urogenital trichomoniasis, unspecified: Secondary | ICD-10-CM | POA: Diagnosis not present

## 2023-08-22 DIAGNOSIS — N898 Other specified noninflammatory disorders of vagina: Secondary | ICD-10-CM | POA: Diagnosis not present

## 2023-12-20 DIAGNOSIS — E1169 Type 2 diabetes mellitus with other specified complication: Secondary | ICD-10-CM | POA: Diagnosis not present

## 2023-12-20 DIAGNOSIS — H6123 Impacted cerumen, bilateral: Secondary | ICD-10-CM | POA: Diagnosis not present

## 2023-12-20 DIAGNOSIS — I10 Essential (primary) hypertension: Secondary | ICD-10-CM | POA: Diagnosis not present

## 2023-12-20 DIAGNOSIS — R252 Cramp and spasm: Secondary | ICD-10-CM | POA: Diagnosis not present

## 2023-12-20 DIAGNOSIS — J309 Allergic rhinitis, unspecified: Secondary | ICD-10-CM | POA: Diagnosis not present

## 2023-12-20 DIAGNOSIS — E782 Mixed hyperlipidemia: Secondary | ICD-10-CM | POA: Diagnosis not present

## 2023-12-20 DIAGNOSIS — Z23 Encounter for immunization: Secondary | ICD-10-CM | POA: Diagnosis not present

## 2024-02-07 DIAGNOSIS — Z113 Encounter for screening for infections with a predominantly sexual mode of transmission: Secondary | ICD-10-CM | POA: Diagnosis not present

## 2024-02-07 DIAGNOSIS — Z1231 Encounter for screening mammogram for malignant neoplasm of breast: Secondary | ICD-10-CM | POA: Diagnosis not present

## 2024-02-07 DIAGNOSIS — Z01419 Encounter for gynecological examination (general) (routine) without abnormal findings: Secondary | ICD-10-CM | POA: Diagnosis not present

## 2024-02-07 DIAGNOSIS — N870 Mild cervical dysplasia: Secondary | ICD-10-CM | POA: Diagnosis not present

## 2024-02-07 DIAGNOSIS — A609 Anogenital herpesviral infection, unspecified: Secondary | ICD-10-CM | POA: Diagnosis not present

## 2024-02-21 DIAGNOSIS — Z113 Encounter for screening for infections with a predominantly sexual mode of transmission: Secondary | ICD-10-CM | POA: Diagnosis not present

## 2024-07-31 DIAGNOSIS — I10 Essential (primary) hypertension: Secondary | ICD-10-CM | POA: Diagnosis not present

## 2024-07-31 DIAGNOSIS — Z0000 Encounter for general adult medical examination without abnormal findings: Secondary | ICD-10-CM | POA: Diagnosis not present

## 2024-07-31 DIAGNOSIS — E782 Mixed hyperlipidemia: Secondary | ICD-10-CM | POA: Diagnosis not present

## 2024-07-31 DIAGNOSIS — E1169 Type 2 diabetes mellitus with other specified complication: Secondary | ICD-10-CM | POA: Diagnosis not present

## 2024-07-31 DIAGNOSIS — Z23 Encounter for immunization: Secondary | ICD-10-CM | POA: Diagnosis not present
